# Patient Record
Sex: Male | Born: 1960
Health system: Southern US, Community
[De-identification: ages and names within clinical notes are randomized; demographics above are authoritative.]

## PROBLEM LIST (undated history)

## (undated) DIAGNOSIS — Z87891 Personal history of nicotine dependence: Secondary | ICD-10-CM

## (undated) HISTORY — DX: Personal history of nicotine dependence: Z87.891

---

## 1998-07-23 ENCOUNTER — Emergency Department (HOSPITAL_COMMUNITY): Admission: EM | Admit: 1998-07-23 | Discharge: 1998-07-23 | Payer: Self-pay | Admitting: Emergency Medicine

## 1998-07-25 ENCOUNTER — Inpatient Hospital Stay (HOSPITAL_COMMUNITY): Admission: AD | Admit: 1998-07-25 | Discharge: 1998-07-27 | Payer: Self-pay | Admitting: Specialist

## 1998-10-05 ENCOUNTER — Emergency Department (HOSPITAL_COMMUNITY): Admission: EM | Admit: 1998-10-05 | Discharge: 1998-10-05 | Payer: Self-pay | Admitting: Emergency Medicine

## 1998-12-27 ENCOUNTER — Emergency Department (HOSPITAL_COMMUNITY): Admission: EM | Admit: 1998-12-27 | Discharge: 1998-12-27 | Payer: Self-pay | Admitting: Emergency Medicine

## 2006-05-21 ENCOUNTER — Ambulatory Visit: Payer: Self-pay | Admitting: Internal Medicine

## 2006-10-30 ENCOUNTER — Telehealth: Payer: Self-pay | Admitting: Internal Medicine

## 2006-12-27 ENCOUNTER — Ambulatory Visit: Payer: Self-pay | Admitting: Internal Medicine

## 2007-01-15 ENCOUNTER — Ambulatory Visit: Payer: Self-pay | Admitting: Internal Medicine

## 2007-01-15 DIAGNOSIS — IMO0002 Reserved for concepts with insufficient information to code with codable children: Secondary | ICD-10-CM | POA: Insufficient documentation

## 2007-01-28 ENCOUNTER — Telehealth (INDEPENDENT_AMBULATORY_CARE_PROVIDER_SITE_OTHER): Payer: Self-pay | Admitting: *Deleted

## 2007-02-07 ENCOUNTER — Encounter: Payer: Self-pay | Admitting: Internal Medicine

## 2007-02-20 ENCOUNTER — Telehealth: Payer: Self-pay | Admitting: Family Medicine

## 2007-05-14 ENCOUNTER — Telehealth: Payer: Self-pay | Admitting: Internal Medicine

## 2007-12-11 ENCOUNTER — Ambulatory Visit: Payer: Self-pay | Admitting: Internal Medicine

## 2007-12-19 ENCOUNTER — Ambulatory Visit: Payer: Self-pay | Admitting: Internal Medicine

## 2007-12-19 DIAGNOSIS — R3589 Other polyuria: Secondary | ICD-10-CM | POA: Insufficient documentation

## 2007-12-19 DIAGNOSIS — R358 Other polyuria: Secondary | ICD-10-CM

## 2007-12-19 LAB — CONVERTED CEMR LAB
Nitrite: NEGATIVE
Urobilinogen, UA: NEGATIVE
WBC Urine, dipstick: NEGATIVE

## 2008-04-07 ENCOUNTER — Telehealth: Payer: Self-pay | Admitting: Internal Medicine

## 2010-08-06 ENCOUNTER — Encounter: Payer: Self-pay | Admitting: Family Medicine

## 2010-08-06 ENCOUNTER — Ambulatory Visit (INDEPENDENT_AMBULATORY_CARE_PROVIDER_SITE_OTHER): Payer: Self-pay | Admitting: Family Medicine

## 2010-08-06 ENCOUNTER — Encounter: Payer: Self-pay | Admitting: Internal Medicine

## 2010-08-06 DIAGNOSIS — R300 Dysuria: Secondary | ICD-10-CM

## 2010-08-06 DIAGNOSIS — J029 Acute pharyngitis, unspecified: Secondary | ICD-10-CM

## 2010-08-06 DIAGNOSIS — R509 Fever, unspecified: Secondary | ICD-10-CM | POA: Insufficient documentation

## 2010-08-06 LAB — POCT URINALYSIS DIPSTICK
Ketones, UA: NEGATIVE
Protein, UA: 30
Spec Grav, UA: 1.02
Urobilinogen, UA: 0.2

## 2010-08-06 MED ORDER — SULFAMETHOXAZOLE-TRIMETHOPRIM 800-160 MG PO TABS: 1.0000 | ORAL_TABLET | Freq: Two times a day (BID) | ORAL | Status: DC

## 2010-08-06 NOTE — Patient Instructions (Signed)
Take the bactrim ds 1 pill twice daily for 7 days  Drink lots of water - stay hydrated  Tylenol ES  Up to 2 pills every 4 hours for fever and chills  If you get worse - higher fever / worse back pain/ nausea/ vomiting - go in to ER  Follow up with Dr Kirtland Bouchard next week - call his office Monday for an appt

## 2010-08-06 NOTE — Assessment & Plan Note (Signed)
With mildly positive ua and neg rapid strep Suspect either viral syndrome / or uti (or prostatitis)  Will cover with sulfa (pt cannot afford cipro) Tylenol for fever /chills  If worse will go to ER Otherwise will f/u with Dr Kirtland Bouchard next week

## 2010-08-06 NOTE — Progress Notes (Signed)
Subjective:    Patient ID: Steven Wilkerson, male    DOB: [birth year], 50 y.o.   MRN: [id number]  HPI Flu like symptoms  Has been 3 days - and feels awful  Came out of nowhere - like a ton of bricks   Aches and pains in back and hips and back  Chills and sweats that are horrible - soaking his clothes  Gets so cold finger tips go numb   Little nasal stuffiness Dry sore throat -- not severe - scratchy Headache - on and off  Very little cough (he does smoke )   No rash   Took a percocet from family member yesterday - that helped   Before that tried advil and aleve  3 advil would help for a little while   Urine is more concentrated  Burned once when he urinated  No blood in urine  No n/v  History   Social History  . Marital Status: Married    Spouse Name: N/A    Number of Children: N/A  . Years of Education: N/A   Occupational History  . Not on file.   Social History Main Topics  . Smoking status: Current Everyday Smoker -- 1.0 packs/day for 30 years    Types: Cigarettes  . Smokeless tobacco: Not on file  . Alcohol Use: No     Sober x 10 years  . Drug Use: No  . Sexually Active: Not on file   Other Topics Concern  . Not on file   Social History Narrative  . No narrative on file   No past medical history on file.         Review of Systems Review of Systems  Constitutional: pos for fatigue/ malaise/fever / no wt changes  Eyes: Negative for pain and visual disturbance.  Respiratory: mild cough/ no sob   Cardiovascular: Negative. For cp or palpitations  Gastrointestinal: Negative for nausea, diarrhea and constipation. or vomiting Genitourinary: Negative for urgency and frequency. Pos for dysuria  Skin: Negative for pallor. neg for rash  Neurological: Negative for weakness, light-headedness, numbness and headaches.  Hematological: Negative for adenopathy. Does not bruise/bleed easily.  Psychiatric/Behavioral: Negative for dysphoric mood. The patient is not  nervous/anxious.          Objective:   Physical Exam  Constitutional: He appears well-developed and well-nourished. No distress.  HENT:  Head: Normocephalic and atraumatic.  Right Ear: External ear normal.  Left Ear: External ear normal.  Mouth/Throat: Oropharynx is clear and moist.       Nares boggy and slightly congested    Eyes: Conjunctivae and EOM are normal. Pupils are equal, round, and reactive to light. Right eye exhibits no discharge. Left eye exhibits no discharge. No scleral icterus.  Neck: Normal range of motion. Neck supple. No JVD present.  Cardiovascular: Normal rate, regular rhythm and normal heart sounds.   Pulmonary/Chest: Effort normal and breath sounds normal. No respiratory distress. He has no wheezes. He has no rales. He exhibits no tenderness.       milldy distant bs throughout  Abdominal: Soft. Bowel sounds are normal. He exhibits no distension and no mass. There is no tenderness.       No suprapubic tenderness    Musculoskeletal: Normal range of motion. He exhibits no edema and no tenderness.       No acute joint changes   Lymphadenopathy:    He has no cervical adenopathy.  Neurological: He is alert. He has normal reflexes.  No tremor   Skin: Skin is warm and dry. No rash noted. He is not diaphoretic. No erythema. No pallor.  Psychiatric: He has a normal mood and affect.          Assessment & Plan:

## 2010-08-07 DIAGNOSIS — J029 Acute pharyngitis, unspecified: Secondary | ICD-10-CM | POA: Insufficient documentation

## 2010-08-07 NOTE — Assessment & Plan Note (Signed)
Rapid strep neg  See assessment for fever  Poss part of viral syndrome  No rash or tick bite

## 2010-08-08 ENCOUNTER — Telehealth: Payer: Self-pay | Admitting: *Deleted

## 2010-08-08 ENCOUNTER — Ambulatory Visit (INDEPENDENT_AMBULATORY_CARE_PROVIDER_SITE_OTHER): Payer: Managed Care, Other (non HMO) | Admitting: Internal Medicine

## 2010-08-08 ENCOUNTER — Encounter: Payer: Self-pay | Admitting: Internal Medicine

## 2010-08-08 VITALS — BP 110/80 | Temp 97.9°F | Wt 156.0 lb

## 2010-08-08 DIAGNOSIS — N390 Urinary tract infection, site not specified: Secondary | ICD-10-CM

## 2010-08-08 DIAGNOSIS — R509 Fever, unspecified: Secondary | ICD-10-CM

## 2010-08-08 DIAGNOSIS — M549 Dorsalgia, unspecified: Secondary | ICD-10-CM

## 2010-08-08 LAB — POCT URINALYSIS DIPSTICK
Blood, UA: NEGATIVE
Leukocytes, UA: NEGATIVE
Nitrite, UA: POSITIVE
pH, UA: 5.5

## 2010-08-08 NOTE — Progress Notes (Signed)
Addended by: Rita Ohara R on: 08/08/2010 04:48 PM   Modules accepted: Orders

## 2010-08-08 NOTE — Patient Instructions (Signed)
Drink as much fluid as you  can tolerate over the next few days  Take your antibiotic as prescribed until ALL of it is gone, but stop if you develop a rash, swelling, or any side effects of the medication.  Contact our office as soon as possible if  there are side effects of the medication.  Call or return to clinic prn if these symptoms worsen or fail to improve as anticipated.   

## 2010-08-08 NOTE — Telephone Encounter (Signed)
[facility] Triage Call Report Triage Record Num: [id number] Operator: Sula Rumple Patient Name: Steven Wilkerson Call Date & Time: 08/06/2010 10:18:34PM Patient Phone: [phone] PCP: Patient Gender: Male PCP Fax : Patient DOB: November 14, 1960 Practice Name: [facility] Reason for Call: Pt calling on 08/06/10 states he is on antibiotic for a UTI/Bactrim/this PM pt is c/o rash on his back and chest. Per Dr Milinda Antis may order Macrobid 100 mg BID x 7 days. [facility] [phone]. Protocol(s) Used: Rash Recommended Outcome per Protocol: Call Provider within 4 Hours Reason for Outcome: New onset of rash after beginning new prescribed, nonprescribed, or alternative/complementary medication Care Advice: ~ 08/06/2010 10:39:37PM Page 1 of 1 [id number]

## 2010-08-08 NOTE — Progress Notes (Signed)
  Subjective:    Patient ID: Steven Wilkerson, male    DOB: 1961/01/12, 50 y.o.   MRN: [id number]  HPI and is 50 year old patient who is seen today in followup. He has a nine-day history of unwellness. He has had some intermittent fever chills. Other complaints include occasional numbness involving the fingertips dark urine and low back pain. He was seen at the [facility] 2 days ago and placed on Septra DS for a suspected UTI. This resulted in a rash and presently he is on nitrofurantoin. A urinalysis was repeated today that revealed trace glucose +2 bilirubin mild ketonuria and positive nitrates negative for leukocytes. He complains of decreased urinary stream dark urine but no dysuria he states he has been drinking fluids well      Review of Systems  Constitutional: Positive for fever, chills, appetite change and fatigue.  HENT: Negative for hearing loss, ear pain, congestion, sore throat, trouble swallowing, neck stiffness, dental problem, voice change and tinnitus.   Eyes: Negative for pain, discharge and visual disturbance.  Respiratory: Negative for cough, chest tightness, wheezing and stridor.   Cardiovascular: Negative for chest pain, palpitations and leg swelling.  Gastrointestinal: Negative for nausea, vomiting, abdominal pain, diarrhea, constipation, blood in stool and abdominal distention.  Genitourinary: Positive for decreased urine volume. Negative for urgency, hematuria, flank pain, discharge, difficulty urinating and genital sores.  Musculoskeletal: Positive for back pain. Negative for myalgias, joint swelling, arthralgias and gait problem.  Skin: Negative for rash.  Neurological: Negative for dizziness, syncope, speech difficulty, weakness, numbness and headaches.  Hematological: Negative for adenopathy. Does not bruise/bleed easily.  Psychiatric/Behavioral: Negative for behavioral problems and dysphoric mood. The patient is not nervous/anxious.        Objective:   Physical  Exam  Constitutional: He is oriented to person, place, and time. He appears well-developed and well-nourished. No distress.  HENT:  Head: Normocephalic.  Right Ear: External ear normal.  Left Ear: External ear normal.  Eyes: Conjunctivae and EOM are normal.       Suggestion of early scleral icterus  Neck: Normal range of motion.  Cardiovascular: Normal rate and normal heart sounds.   Pulmonary/Chest: Breath sounds normal.  Abdominal: Soft. Bowel sounds are normal.       Mild left and right upper quadrant tenderness bowel sounds active no definite organomegaly  Musculoskeletal: Normal range of motion. He exhibits no edema and no tenderness.  Neurological: He is alert and oriented to person, place, and time.  Skin: No rash noted.  Psychiatric: He has a normal mood and affect. His behavior is normal.          Assessment & Plan:  History of febrile illness Rule out hepatitis History of UTI  CBC CMP will be reviewed He has been asked to force fluids and continue antibiotic therapy

## 2010-08-09 ENCOUNTER — Telehealth: Payer: Self-pay

## 2010-08-09 LAB — CBC WITH DIFFERENTIAL/PLATELET
Basophils Relative: 0.4 % (ref 0.0–3.0)
Eosinophils Relative: 2.4 % (ref 0.0–5.0)
HCT: 41.1 % (ref 39.0–52.0)
Hemoglobin: 14.1 g/dL (ref 13.0–17.0)
Lymphocytes Relative: 27.2 % (ref 12.0–46.0)
Lymphs Abs: 1.5 10*3/uL (ref 0.7–4.0)
Monocytes Relative: 15.1 % — ABNORMAL HIGH (ref 3.0–12.0)
Neutro Abs: 3 10*3/uL (ref 1.4–7.7)
RBC: 4.35 Mil/uL (ref 4.22–5.81)
WBC: 5.4 10*3/uL (ref 4.5–10.5)

## 2010-08-09 LAB — HEPATIC FUNCTION PANEL
ALT: 150 U/L — ABNORMAL HIGH (ref 0–53)
AST: 158 U/L — ABNORMAL HIGH (ref 0–37)
Bilirubin, Direct: 0.3 mg/dL (ref 0.0–0.3)
Total Bilirubin: 0.9 mg/dL (ref 0.3–1.2)
Total Protein: 6.7 g/dL (ref 6.0–8.3)

## 2010-08-09 LAB — BASIC METABOLIC PANEL
CO2: 28 mEq/L (ref 19–32)
Chloride: 106 mEq/L (ref 96–112)
Creatinine, Ser: 0.8 mg/dL (ref 0.4–1.5)
Potassium: 4.1 mEq/L (ref 3.5–5.1)

## 2010-08-09 NOTE — Progress Notes (Signed)
Quick Note:  Spoke with pt - informed of lab results and sono to be ordered in AM - instructed to be NPO after midnight - will get with terri in AM to order sono ______

## 2010-08-09 NOTE — Telephone Encounter (Signed)
Opened in error

## 2010-08-10 ENCOUNTER — Other Ambulatory Visit: Payer: Self-pay | Admitting: Internal Medicine

## 2010-08-10 ENCOUNTER — Telehealth: Payer: Self-pay | Admitting: Internal Medicine

## 2010-08-10 ENCOUNTER — Ambulatory Visit (HOSPITAL_BASED_OUTPATIENT_CLINIC_OR_DEPARTMENT_OTHER)
Admission: RE | Admit: 2010-08-10 | Discharge: 2010-08-10 | Disposition: A | Discharge status: Home / Self Care | Payer: Managed Care, Other (non HMO) | Source: Ambulatory Visit | Attending: Internal Medicine | Admitting: Internal Medicine

## 2010-08-10 DIAGNOSIS — R945 Abnormal results of liver function studies: Secondary | ICD-10-CM | POA: Insufficient documentation

## 2010-08-10 DIAGNOSIS — R1012 Left upper quadrant pain: Secondary | ICD-10-CM | POA: Insufficient documentation

## 2010-08-10 DIAGNOSIS — R110 Nausea: Secondary | ICD-10-CM | POA: Insufficient documentation

## 2010-08-10 DIAGNOSIS — R935 Abnormal findings on diagnostic imaging of other abdominal regions, including retroperitoneum: Secondary | ICD-10-CM

## 2010-08-10 DIAGNOSIS — R112 Nausea with vomiting, unspecified: Secondary | ICD-10-CM

## 2010-08-10 DIAGNOSIS — R748 Abnormal levels of other serum enzymes: Secondary | ICD-10-CM

## 2010-08-10 DIAGNOSIS — R7989 Other specified abnormal findings of blood chemistry: Secondary | ICD-10-CM

## 2010-08-10 DIAGNOSIS — R509 Fever, unspecified: Secondary | ICD-10-CM | POA: Insufficient documentation

## 2010-08-10 NOTE — Telephone Encounter (Signed)
Pts wife called. Pt in extreme pain. Req results from ultrasound asap. Pt needs to know if he needs to continue antibiotics, or what he is suppose to do?

## 2010-08-10 NOTE — Telephone Encounter (Signed)
Results printed and on your desk please advise

## 2010-08-10 NOTE — Telephone Encounter (Signed)
Called and discussed. Recommend he reports in the hospital for admission and further evaluation

## 2010-08-10 NOTE — Progress Notes (Signed)
Quick Note:  Order places - terri aware ______

## 2010-08-11 ENCOUNTER — Telehealth: Payer: Self-pay | Admitting: *Deleted

## 2010-08-11 ENCOUNTER — Telehealth: Payer: Self-pay | Admitting: Internal Medicine

## 2010-08-11 DIAGNOSIS — R1032 Left lower quadrant pain: Secondary | ICD-10-CM

## 2010-08-11 NOTE — Telephone Encounter (Signed)
Spoke with wife - informed that a hep test was not run with other labs - will f/u after CT done tomorrow. Dr. Amador Cunas will order any other labs if indicated. KIK

## 2010-08-11 NOTE — Telephone Encounter (Signed)
Had labs done the other day,. Was hepatitis test ran? Leave message.

## 2010-08-11 NOTE — Telephone Encounter (Signed)
Pt is asking if he can have a CT scan instead of hospital admission.  Per Dr. Scotty Court, please order per recommendation on U/S results. Per Dr. Kirtland Bouchard, may order CT.  Pt notifed.

## 2010-08-12 ENCOUNTER — Ambulatory Visit (HOSPITAL_BASED_OUTPATIENT_CLINIC_OR_DEPARTMENT_OTHER)
Admission: RE | Admit: 2010-08-12 | Discharge: 2010-08-12 | Disposition: A | Discharge status: Home / Self Care | Payer: Managed Care, Other (non HMO) | Source: Ambulatory Visit | Attending: Internal Medicine | Admitting: Internal Medicine

## 2010-08-12 ENCOUNTER — Emergency Department (HOSPITAL_BASED_OUTPATIENT_CLINIC_OR_DEPARTMENT_OTHER)
Admission: EM | Admit: 2010-08-12 | Discharge: 2010-08-12 | Disposition: A | Discharge status: Home / Self Care | Payer: BC Managed Care – PPO | Attending: Emergency Medicine | Admitting: Emergency Medicine

## 2010-08-12 ENCOUNTER — Telehealth: Payer: Self-pay | Admitting: *Deleted

## 2010-08-12 DIAGNOSIS — R5383 Other fatigue: Secondary | ICD-10-CM | POA: Insufficient documentation

## 2010-08-12 DIAGNOSIS — F17200 Nicotine dependence, unspecified, uncomplicated: Secondary | ICD-10-CM | POA: Insufficient documentation

## 2010-08-12 DIAGNOSIS — R1032 Left lower quadrant pain: Secondary | ICD-10-CM

## 2010-08-12 DIAGNOSIS — R1012 Left upper quadrant pain: Secondary | ICD-10-CM | POA: Insufficient documentation

## 2010-08-12 DIAGNOSIS — R509 Fever, unspecified: Secondary | ICD-10-CM | POA: Insufficient documentation

## 2010-08-12 DIAGNOSIS — A779 Spotted fever, unspecified: Secondary | ICD-10-CM | POA: Insufficient documentation

## 2010-08-12 DIAGNOSIS — R5381 Other malaise: Secondary | ICD-10-CM | POA: Insufficient documentation

## 2010-08-12 LAB — DIFFERENTIAL
Basophils Absolute: 0 10*3/uL (ref 0.0–0.1)
Eosinophils Absolute: 0.1 10*3/uL (ref 0.0–0.7)
Lymphocytes Relative: 42 % (ref 12–46)
Lymphs Abs: 4.2 10*3/uL — ABNORMAL HIGH (ref 0.7–4.0)
Neutro Abs: 4.4 10*3/uL (ref 1.7–7.7)

## 2010-08-12 LAB — COMPREHENSIVE METABOLIC PANEL
AST: 63 U/L — ABNORMAL HIGH (ref 0–37)
CO2: 27 mEq/L (ref 19–32)
Calcium: 8.6 mg/dL (ref 8.4–10.5)
Chloride: 92 mEq/L — ABNORMAL LOW (ref 96–112)
Creatinine, Ser: 0.6 mg/dL (ref 0.50–1.35)
GFR calc Af Amer: >60 mL/min (ref >60)
GFR calc non Af Amer: >60 mL/min (ref >60)
Glucose, Bld: 125 mg/dL — ABNORMAL HIGH (ref 70–99)
Total Bilirubin: 1.6 mg/dL — ABNORMAL HIGH (ref 0.3–1.2)

## 2010-08-12 LAB — URINALYSIS, ROUTINE W REFLEX MICROSCOPIC
Glucose, UA: NEGATIVE mg/dL
Ketones, ur: NEGATIVE mg/dL
Nitrite: NEGATIVE
Specific Gravity, Urine: 1.044 — ABNORMAL HIGH (ref 1.005–1.030)
pH: 6.5 (ref 5.0–8.0)

## 2010-08-12 LAB — CBC
MCH: 31.5 pg (ref 26.0–34.0)
MCV: 86.6 fL (ref 78.0–100.0)
Platelets: 127 10*3/uL — ABNORMAL LOW (ref 150–400)
RDW: 11.4 % — ABNORMAL LOW (ref 11.5–15.5)
WBC: 9.9 10*3/uL (ref 4.0–10.5)

## 2010-08-12 LAB — URINE MICROSCOPIC-ADD ON

## 2010-08-12 LAB — CK: Total CK: 89 U/L (ref 7–232)

## 2010-08-12 MED ORDER — IOHEXOL 300 MG/ML  SOLN
100.0000 mL | Freq: Once | INTRAMUSCULAR | Status: AC | PRN
  Administered 2010-08-12: 100 mL via INTRAVENOUS

## 2010-08-12 NOTE — Telephone Encounter (Signed)
Pt called req ct results. Pt has already called imaging and was told that the ct has been read and forwarded to doctor Amador Cunas.

## 2010-08-12 NOTE — Telephone Encounter (Signed)
Called and discussed

## 2010-08-12 NOTE — Telephone Encounter (Signed)
Pt's wife calling to get results of stat CT done this morning.  Also patient is in a lot of pain would like to know if something can be called in for him.

## 2010-08-12 NOTE — Telephone Encounter (Signed)
Please advise - CT results in chart

## 2010-08-17 ENCOUNTER — Ambulatory Visit (INDEPENDENT_AMBULATORY_CARE_PROVIDER_SITE_OTHER): Payer: Managed Care, Other (non HMO) | Admitting: Internal Medicine

## 2010-08-17 VITALS — BP 102/62 | HR 95 | Temp 97.6°F | Resp 12 | Wt 155.0 lb

## 2010-08-17 DIAGNOSIS — E871 Hypo-osmolality and hyponatremia: Secondary | ICD-10-CM

## 2010-08-17 DIAGNOSIS — E876 Hypokalemia: Secondary | ICD-10-CM

## 2010-08-17 DIAGNOSIS — K759 Inflammatory liver disease, unspecified: Secondary | ICD-10-CM

## 2010-08-17 LAB — HEPATIC FUNCTION PANEL
ALT: 55 U/L — ABNORMAL HIGH (ref 0–53)
AST: 47 U/L — ABNORMAL HIGH (ref 0–37)
Albumin: 3.8 g/dL (ref 3.5–5.2)
Alkaline Phosphatase: 90 U/L (ref 39–117)
Total Protein: 6.9 g/dL (ref 6.0–8.3)

## 2010-08-17 LAB — BRAIN NATRIURETIC PEPTIDE: Pro B Natriuretic peptide (BNP): 13 pg/mL (ref 0.0–100.0)

## 2010-08-17 MED ORDER — TRAMADOL HCL 50 MG PO TBDP: 50.0000 mg | ORAL_TABLET | Freq: Four times a day (QID) | ORAL | Status: DC | PRN

## 2010-08-17 NOTE — Patient Instructions (Signed)
Call or return to clinic prn if these symptoms worsen or fail to improve as anticipated.

## 2010-08-18 ENCOUNTER — Encounter: Payer: Self-pay | Admitting: Internal Medicine

## 2010-08-18 NOTE — Progress Notes (Signed)
  Subjective:    Patient ID: Steven Wilkerson, male    DOB: 07-Jun-1960, 50 y.o.   MRN: [id number]  HPI   50 year old patient who is seen today in followup. He has had an acute febrile illness with elevated liver function studies. He was evaluated and treated in the ED last week and was noted to have significant electrolyte abnormalities. He received IV fluids and was noted to have both hyponatremia and hypokalemia. Today he feels much improved. Liver fossa studies were improving. ED records were reviewed. He has had outpatient abdominal ultrasound and subsequent abdominal CT scan due to his elevated liver function studies.  He presently is on doxycycline due to his acute febrile illness tick exposure and is noted to have a rash last week. [facility] spotted fever titers were negative he feels much improved. He is requesting FMLA form completion on his behalf    Review of Systems  Constitutional: Positive for fatigue. Negative for fever, chills and appetite change.  HENT: Negative for hearing loss, ear pain, congestion, sore throat, trouble swallowing, neck stiffness, dental problem, voice change and tinnitus.   Eyes: Negative for pain, discharge and visual disturbance.  Respiratory: Negative for cough, chest tightness, wheezing and stridor.   Cardiovascular: Negative for chest pain, palpitations and leg swelling.  Gastrointestinal: Negative for nausea, vomiting, abdominal pain, diarrhea, constipation, blood in stool and abdominal distention.  Genitourinary: Negative for urgency, hematuria, flank pain, discharge, difficulty urinating and genital sores.  Musculoskeletal: Negative for myalgias, back pain, joint swelling, arthralgias and gait problem.  Skin: Negative for rash.  Neurological: Positive for weakness. Negative for dizziness, syncope, speech difficulty, numbness and headaches.  Hematological: Negative for adenopathy. Does not bruise/bleed easily.  Psychiatric/Behavioral: Negative for  behavioral problems and dysphoric mood. The patient is not nervous/anxious.        Objective:   Physical Exam  Constitutional: He is oriented to person, place, and time. He appears well-developed.  HENT:  Head: Normocephalic.  Right Ear: External ear normal.  Left Ear: External ear normal.  Eyes: Conjunctivae and EOM are normal.  Neck: Normal range of motion.  Cardiovascular: Normal rate and normal heart sounds.   Pulmonary/Chest: Breath sounds normal.  Abdominal: Bowel sounds are normal.  Musculoskeletal: Normal range of motion. He exhibits no edema and no tenderness.  Neurological: He is alert and oriented to person, place, and time.  Psychiatric: He has a normal mood and affect. His behavior is normal.          Assessment & Plan:   Resolving hepatitis. Probable viral. The patient is present on doxycycline he'll complete therapy Electrolyte imbalance. We'll check followup electrolytes today as well as liver function studies FMLA forms will be completed The patient is clinically much improved or return here when necessary

## 2010-08-26 ENCOUNTER — Telehealth: Payer: Self-pay | Admitting: Internal Medicine

## 2010-08-26 NOTE — Telephone Encounter (Signed)
Spoke with pt - informed of dr. Vernon Prey instructions , will order mobic , and he does request referal to back doctor. Will order and terri will caontact once taken care of next week. KIK Please advise - ortho? Neuro?

## 2010-08-26 NOTE — Telephone Encounter (Signed)
Please advise 

## 2010-08-26 NOTE — Telephone Encounter (Signed)
Okay to refill generic Mobic 15 mg #50 to take one daily as needed Cannot give stronger narcotic than Vicodin-ask  patient if  he would like referral to a back specialist
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
## 2010-08-29 ENCOUNTER — Telehealth: Payer: Self-pay | Admitting: Internal Medicine

## 2010-08-29 NOTE — Telephone Encounter (Signed)
Do you recall these forms? 

## 2010-08-29 NOTE — Telephone Encounter (Signed)
Checking on status of FMLA  forms to be filled out. Saw Dr Kirtland Bouchard last week.

## 2010-08-30 NOTE — Telephone Encounter (Signed)
Steven Wilkerson please check on if these forms were faxed out, and let me know

## 2010-08-30 NOTE — Telephone Encounter (Signed)
Pts spouse called to check on status of FMLA forms. Form was suppose to be faxed back to # on Form asap. Pls call.

## 2010-09-01 ENCOUNTER — Telehealth: Payer: Self-pay | Admitting: Internal Medicine

## 2010-09-01 MED ORDER — HYDROCODONE-ACETAMINOPHEN 5-325 MG PO TABS: 2.0000 | ORAL_TABLET | Freq: Four times a day (QID) | ORAL | Status: DC | PRN

## 2010-09-01 MED ORDER — MELOXICAM 15 MG PO TABS: 15.0000 mg | ORAL_TABLET | Freq: Every day | ORAL | Status: DC

## 2010-09-01 NOTE — Telephone Encounter (Signed)
Pt aware rx called in to [facility]

## 2010-09-01 NOTE — Telephone Encounter (Signed)
Needs status of Mobic and a higher dosage of Vicoden sent to [facility].

## 2010-09-01 NOTE — Telephone Encounter (Signed)
Continue present Vicodin dose-  may use one or 2 every 6 hours as needed

## 2010-09-01 NOTE — Telephone Encounter (Signed)
I took care og mobic - but is there a higher dose of vicodin to be ordered? He was given hydroco-apap 5/325mg  - please advise

## 2010-09-02 ENCOUNTER — Telehealth: Payer: Self-pay | Admitting: *Deleted

## 2010-09-02 NOTE — Telephone Encounter (Signed)
Office follow Monday or Tuesday

## 2010-09-02 NOTE — Telephone Encounter (Signed)
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 I had discussed with dr. Amador Cunas - he indicated pt had been placed on ABX-doxy, and that should be ok. After talking with him - he is ok with waiting to see ortho - since he has completed abx- I will forward to dr. Amador Cunas.

## 2010-09-02 NOTE — Telephone Encounter (Signed)
Attempt to call- ansmach - LMTCB and schedule appt - dr. Amador Cunas would like to f/u on Monday or tuesday

## 2010-09-02 NOTE — Telephone Encounter (Signed)
Pt is asking for a referral to Infectious Disease re: all the symptoms he has had in the last month.  He has been extremely ill, and would really like to be evaluated to see what his diagnosis might be.

## 2011-08-28 ENCOUNTER — Encounter (HOSPITAL_COMMUNITY): Payer: Self-pay | Admitting: *Deleted

## 2011-08-28 ENCOUNTER — Emergency Department (HOSPITAL_COMMUNITY)
Admission: EM | Admit: 2011-08-28 | Discharge: 2011-08-28 | Disposition: A | Discharge status: Home / Self Care | Payer: No Typology Code available for payment source | Attending: Emergency Medicine | Admitting: Emergency Medicine

## 2011-08-28 ENCOUNTER — Emergency Department (HOSPITAL_COMMUNITY): Payer: No Typology Code available for payment source

## 2011-08-28 DIAGNOSIS — M542 Cervicalgia: Secondary | ICD-10-CM | POA: Insufficient documentation

## 2011-08-28 DIAGNOSIS — M25529 Pain in unspecified elbow: Secondary | ICD-10-CM | POA: Insufficient documentation

## 2011-08-28 DIAGNOSIS — IMO0002 Reserved for concepts with insufficient information to code with codable children: Secondary | ICD-10-CM | POA: Insufficient documentation

## 2011-08-28 DIAGNOSIS — M545 Low back pain, unspecified: Secondary | ICD-10-CM | POA: Insufficient documentation

## 2011-08-28 DIAGNOSIS — M25579 Pain in unspecified ankle and joints of unspecified foot: Secondary | ICD-10-CM | POA: Insufficient documentation

## 2011-08-28 DIAGNOSIS — F17200 Nicotine dependence, unspecified, uncomplicated: Secondary | ICD-10-CM | POA: Insufficient documentation

## 2011-08-28 DIAGNOSIS — T07XXXA Unspecified multiple injuries, initial encounter: Secondary | ICD-10-CM

## 2011-08-28 DIAGNOSIS — M25569 Pain in unspecified knee: Secondary | ICD-10-CM | POA: Insufficient documentation

## 2011-08-28 DIAGNOSIS — Z79899 Other long term (current) drug therapy: Secondary | ICD-10-CM | POA: Insufficient documentation

## 2011-08-28 DIAGNOSIS — R079 Chest pain, unspecified: Secondary | ICD-10-CM | POA: Insufficient documentation

## 2011-08-28 DIAGNOSIS — M25519 Pain in unspecified shoulder: Secondary | ICD-10-CM | POA: Insufficient documentation

## 2011-08-28 DIAGNOSIS — S0081XA Abrasion of other part of head, initial encounter: Secondary | ICD-10-CM

## 2011-08-28 MED ORDER — MELOXICAM 7.5 MG PO TABS: ORAL_TABLET | ORAL | Status: DC

## 2011-08-28 MED ORDER — HYDROCODONE-ACETAMINOPHEN 7.5-325 MG PO TABS: 1.0000 | ORAL_TABLET | ORAL | Status: DC | PRN

## 2011-08-28 MED ORDER — METHOCARBAMOL 500 MG PO TABS: ORAL_TABLET | ORAL | Status: DC
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
## 2011-08-31 NOTE — ED Provider Notes (Signed)
Medical screening examination/treatment/procedure(s) were performed by non-physician practitioner and as supervising physician I was immediately available for consultation/collaboration.   Wilmetta Speiser W. Panayiota Larkin, MD 08/31/11 1235 

## 2011-09-01 ENCOUNTER — Ambulatory Visit (INDEPENDENT_AMBULATORY_CARE_PROVIDER_SITE_OTHER): Payer: No Typology Code available for payment source | Admitting: Internal Medicine

## 2011-09-01 ENCOUNTER — Encounter: Payer: Self-pay | Admitting: Internal Medicine

## 2011-09-01 VITALS — BP 100/80 | Temp 97.6°F | Wt 157.0 lb

## 2011-09-01 DIAGNOSIS — IMO0002 Reserved for concepts with insufficient information to code with codable children: Secondary | ICD-10-CM

## 2011-09-01 DIAGNOSIS — R52 Pain, unspecified: Secondary | ICD-10-CM

## 2011-09-01 MED ORDER — HYDROCODONE-ACETAMINOPHEN 7.5-325 MG PO TABS: 1.0000 | ORAL_TABLET | ORAL | Status: DC | PRN

## 2011-09-01 MED ORDER — MEPERIDINE HCL 25 MG/ML IJ SOLN
50.0000 mg | Freq: Once | INTRAMUSCULAR | Status: AC
  Administered 2011-09-01: 50 mg via INTRAMUSCULAR

## 2011-09-01 MED ORDER — PROMETHAZINE HCL 50 MG/ML IJ SOLN
50.0000 mg | Freq: Once | INTRAMUSCULAR | Status: AC
  Administered 2011-09-01: 50 mg via INTRAMUSCULAR

## 2011-09-01 MED ORDER — CYCLOBENZAPRINE HCL 10 MG PO TABS: 10.0000 mg | ORAL_TABLET | Freq: Three times a day (TID) | ORAL | Status: AC | PRN
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
## 2011-09-01 NOTE — Patient Instructions (Signed)
You  may move around, but avoid painful motions and activities.  Apply heat to the sore area for 15 to 20 minutes 3 or 4 times daily for the next two to 3 days. 

## 2011-09-05 ENCOUNTER — Other Ambulatory Visit: Payer: Self-pay | Admitting: Internal Medicine

## 2011-09-05 NOTE — Telephone Encounter (Signed)
30
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
## 2011-09-24 ENCOUNTER — Other Ambulatory Visit: Payer: Self-pay | Admitting: Internal Medicine
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
## 2011-09-25 NOTE — Telephone Encounter (Signed)
ok 

## 2011-10-10 ENCOUNTER — Other Ambulatory Visit: Payer: Self-pay | Admitting: Internal Medicine
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
## 2011-10-11 NOTE — Telephone Encounter (Signed)
ok 

## 2011-10-26 ENCOUNTER — Other Ambulatory Visit: Payer: Self-pay | Admitting: Internal Medicine
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
## 2011-10-27 NOTE — Telephone Encounter (Signed)
Called in.

## 2011-10-27 NOTE — Telephone Encounter (Signed)
ok 

## 2011-11-06 ENCOUNTER — Other Ambulatory Visit: Payer: Self-pay | Admitting: Internal Medicine

## 2011-11-06 NOTE — Telephone Encounter (Signed)
ok 
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
## 2011-11-07 NOTE — Telephone Encounter (Signed)
Called in.

## 2011-11-20 ENCOUNTER — Other Ambulatory Visit: Payer: Self-pay | Admitting: Internal Medicine

## 2011-11-20 NOTE — Telephone Encounter (Signed)
30
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
## 2011-12-04 ENCOUNTER — Other Ambulatory Visit: Payer: Self-pay | Admitting: Internal Medicine

## 2011-12-05 NOTE — Telephone Encounter (Signed)
ok 

## 2011-12-05 NOTE — Telephone Encounter (Signed)
Last seen 09/01/11  Last written 11/20/11  # 30  0RF Please advise

## 2011-12-05 NOTE — Telephone Encounter (Signed)
CALLED IN 

## 2011-12-16 ENCOUNTER — Other Ambulatory Visit: Payer: Self-pay | Admitting: Internal Medicine
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
## 2011-12-18 NOTE — Telephone Encounter (Signed)
Called into [facility]

## 2011-12-18 NOTE — Telephone Encounter (Signed)
ok 

## 2011-12-25 ENCOUNTER — Ambulatory Visit: Payer: PRIVATE HEALTH INSURANCE | Admitting: Internal Medicine

## 2011-12-26 ENCOUNTER — Encounter: Payer: Self-pay | Admitting: Internal Medicine

## 2011-12-26 ENCOUNTER — Ambulatory Visit (INDEPENDENT_AMBULATORY_CARE_PROVIDER_SITE_OTHER): Payer: PRIVATE HEALTH INSURANCE | Admitting: Internal Medicine

## 2011-12-26 VITALS — BP 122/80 | Wt 162.0 lb

## 2011-12-26 DIAGNOSIS — IMO0002 Reserved for concepts with insufficient information to code with codable children: Secondary | ICD-10-CM

## 2011-12-26 NOTE — Patient Instructions (Signed)
Orthopedic evaluation as discussed 
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
## 2012-01-02 ENCOUNTER — Other Ambulatory Visit: Payer: Self-pay | Admitting: Internal Medicine

## 2012-01-02 NOTE — Telephone Encounter (Signed)
30

## 2012-01-02 NOTE — Telephone Encounter (Signed)
Norco refill request for pt. Last filled on 10/26. Pt last seen on 11/5 ok to refill?

## 2012-01-03 NOTE — Telephone Encounter (Signed)
Med called in

## 2012-01-22 ENCOUNTER — Other Ambulatory Visit: Payer: Self-pay | Admitting: Internal Medicine

## 2012-01-23 ENCOUNTER — Telehealth: Payer: Self-pay | Admitting: Internal Medicine

## 2012-01-23 NOTE — Telephone Encounter (Signed)
Pt needs to get another referral to Orthopedic doctor. Pt req to see Dr Romeo Apple in [place], [place].       Phone # [phone] and the fax # [phone]. Need to get copy of doctors notes from Dr Amador Cunas and also put on referral that pt went to [facility].

## 2012-01-25 NOTE — Telephone Encounter (Signed)
All ok

## 2012-01-25 NOTE — Telephone Encounter (Signed)
48

## 2012-01-26 ENCOUNTER — Other Ambulatory Visit: Payer: Self-pay | Admitting: Internal Medicine

## 2012-01-26 DIAGNOSIS — IMO0002 Reserved for concepts with insufficient information to code with codable children: Secondary | ICD-10-CM

## 2012-01-26 NOTE — Telephone Encounter (Signed)
Has referral been performed??

## 2012-02-12 ENCOUNTER — Other Ambulatory Visit: Payer: Self-pay | Admitting: Internal Medicine

## 2012-03-06 ENCOUNTER — Other Ambulatory Visit: Payer: Self-pay | Admitting: Internal Medicine

## 2012-03-22 ENCOUNTER — Other Ambulatory Visit: Payer: Self-pay | Admitting: Internal Medicine

## 2012-03-27 ENCOUNTER — Other Ambulatory Visit: Payer: Self-pay | Admitting: *Deleted

## 2012-04-06 IMAGING — CT CT ABD-PELV W/ CM
2 of 5 series · 16 of 46 positions shown, 18 images · IV contrast (APPLIED)
Comparison: Ultrasound of the abdomen of 08/10/2010

CLINICAL DATA: Abdomen and left upper quadrant pain, fever, chills

CT ABDOMEN AND PELVIS WITH CONTRAST
TECHNIQUE: Multidetector CT imaging of the abdomen and pelvis was
performed following the standard protocol during bolus
administration of intravenous contrast.
Contrast: 100 ml Cmnipaque-ASS

[Series 2: abd/pelvis 5.0 b31f · axial · 0.64mm/px · z∈[+1030,+1386]mm · 13 of 79 slices shown, 15 images]
[im 4/79  soft-tissue]
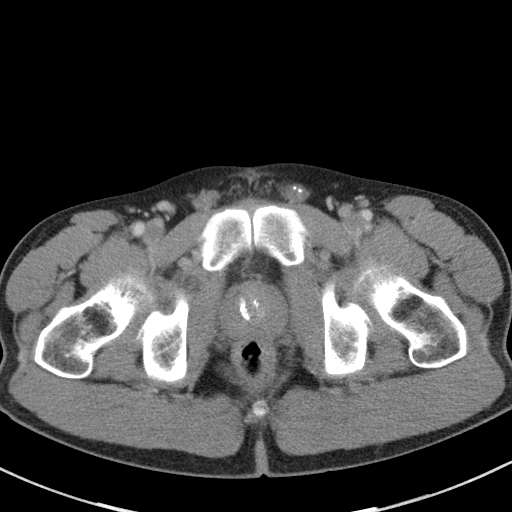
[im 4/79  bone]
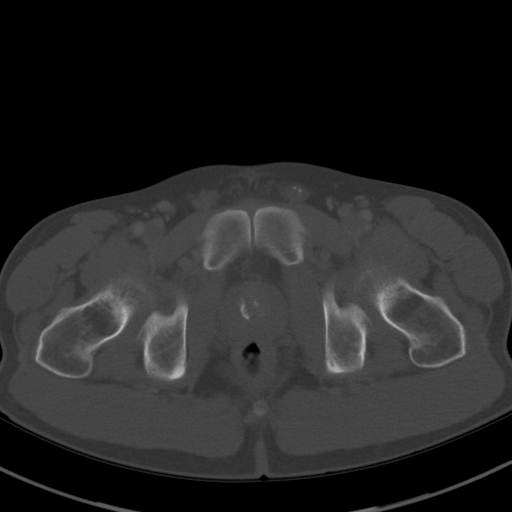
[im 12/79  soft-tissue]
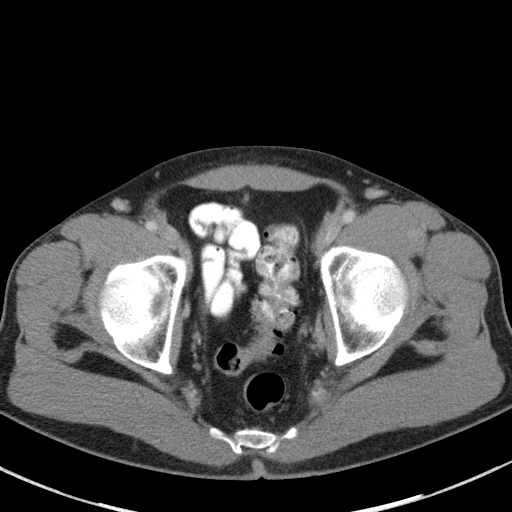
[im 16/79  soft-tissue]
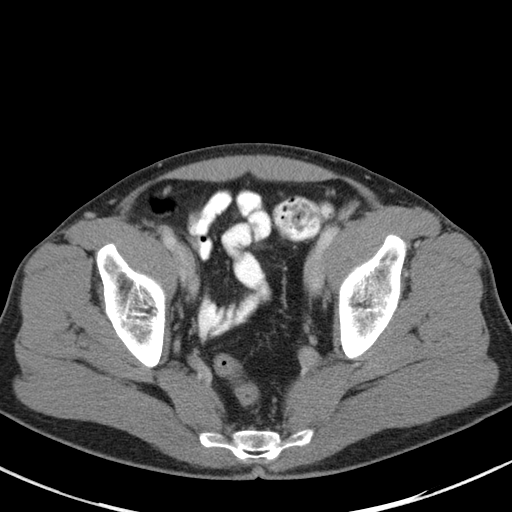
[im 24/79  soft-tissue]
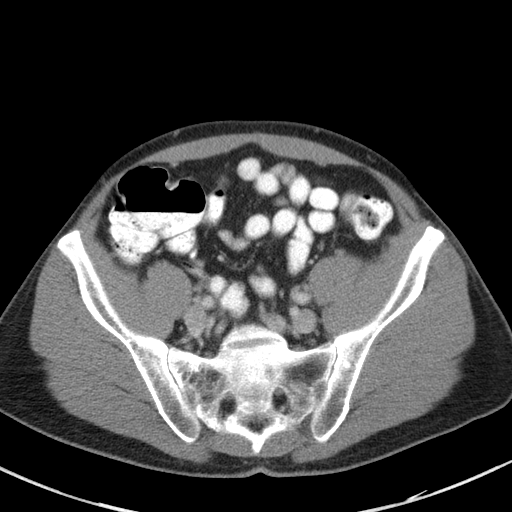
[im 28/79  soft-tissue]
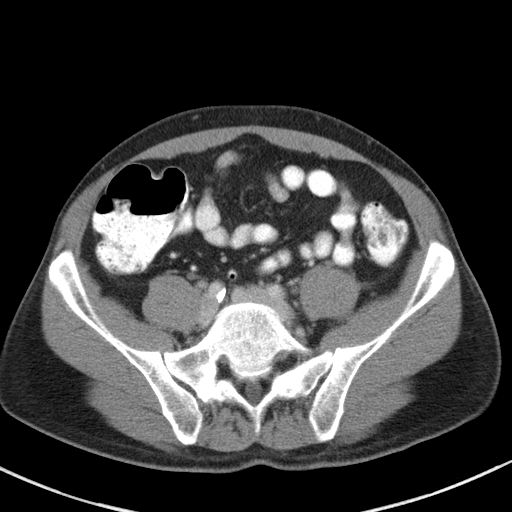
[im 36/79  soft-tissue]
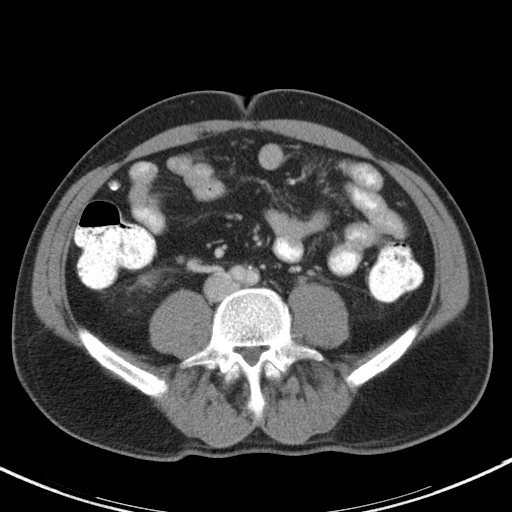
[im 40/79  soft-tissue]
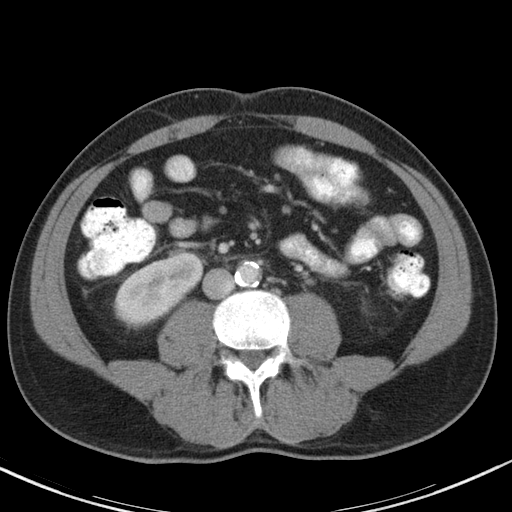
[im 43/79  soft-tissue]
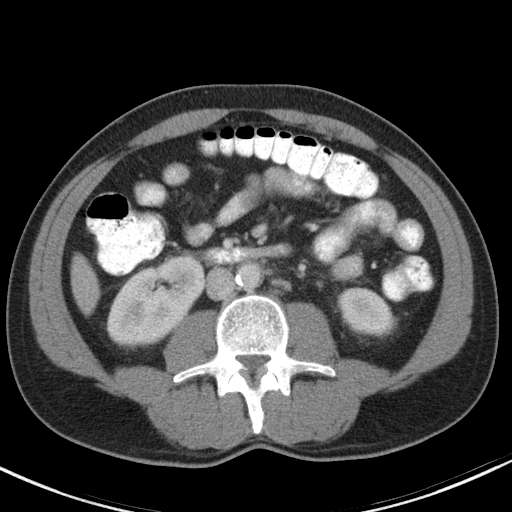
[im 51/79  soft-tissue]
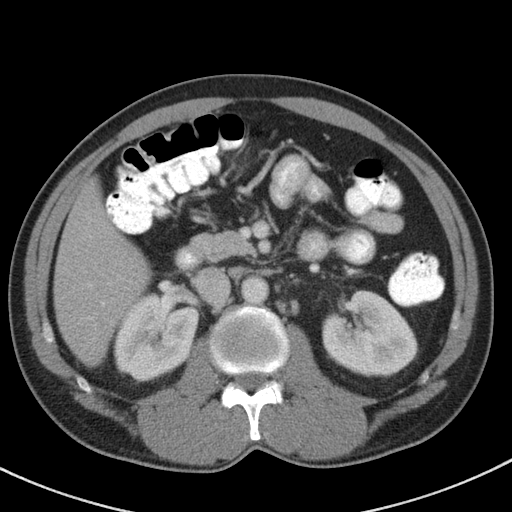
[im 51/79  bone]
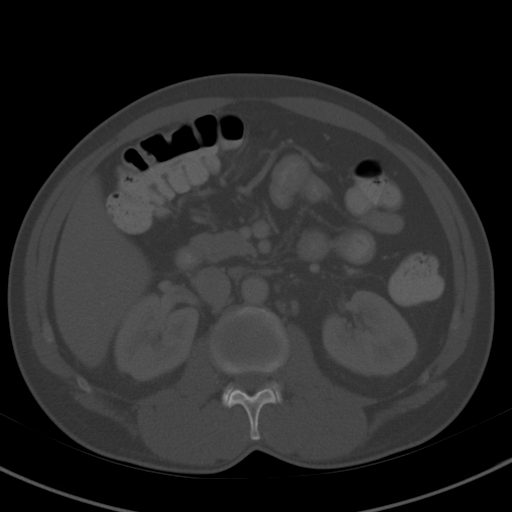
[im 55/79  soft-tissue]
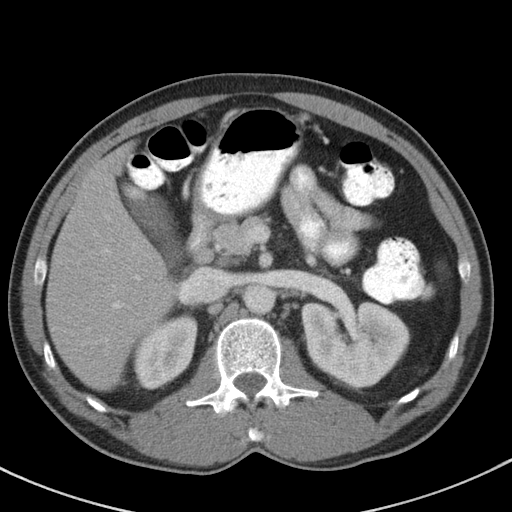
[im 63/79  soft-tissue]
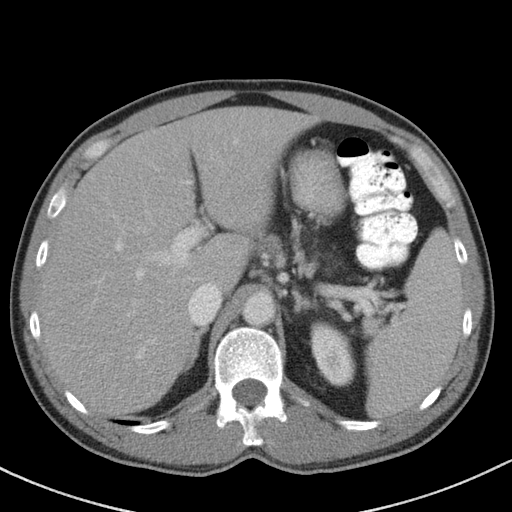
[im 67/79  soft-tissue]
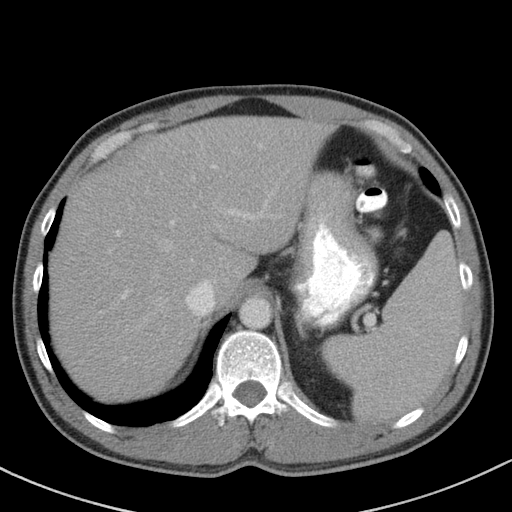
[im 75/79  soft-tissue]
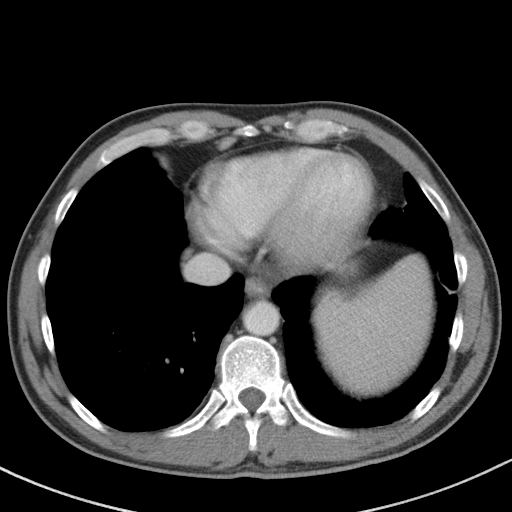

[Series 5: abd/pelvis 3.0 coronal · coronal · 0.63mm/px · 3 of 87 slices shown]
[im 29/87  soft-tissue]
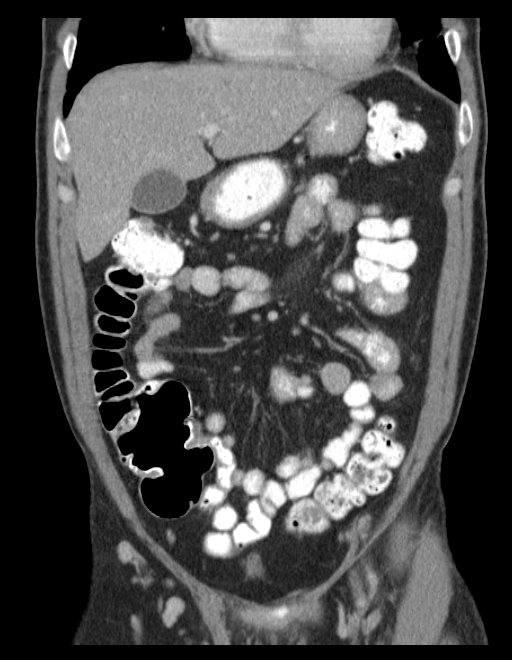
[im 39/87  soft-tissue]
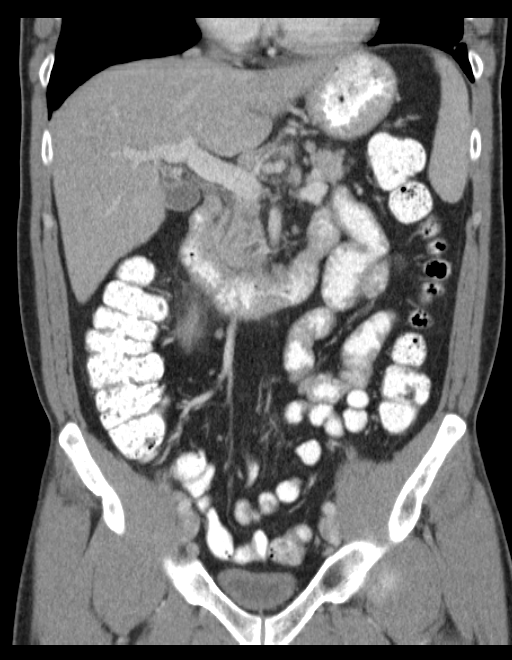
[im 48/87  soft-tissue]
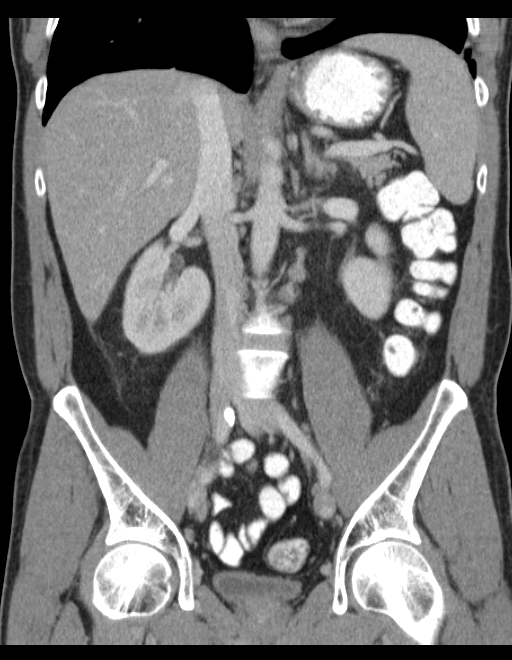

[16 of 46 positions shown; findings below may reference images not displayed]

FINDINGS: The lung bases are clear.  No effusion is seen.  The
liver enhances and no definite ductal dilatation is seen as was
questioned on recent ultrasound.  Only a single small low
attenuation lesion is noted in the dome of the liver most
consistent with benign process such as cyst.  No calcified
gallstones are seen.  The pancreas is normal in size and the
pancreatic duct is not dilated.  The adrenal glands and spleen are
unremarkable although the spleen is minimally prominent.  The
stomach is not well distended.  The kidneys enhance with no
calculus or mass and no hydronephrosis is seen.  The abdominal
aorta is normal in caliber with moderate atheromatous change
present.  No adenopathy is noted.

The appendix is well seen in the right lower quadrant and fills
with air normally.  The terminal ileum is unremarkable.  The
urinary bladder is somewhat thick-walled but not distended.  The
prostate is prominent and a degree of bladder outlet obstruction is
a consideration.  The prostate measures 34 x 42 mm and contains
calcifications.  No abnormality of the colon is seen.  No fluid is
noted within the pelvis.  There is a partial compression deformity
of L2 vertebral body of uncertain age.  Clinical correlation is
recommended.
IMPRESSION: 1.  No acute abnormality on CT of the abdomen and pelvis.
2.  No evidence of intrahepatic ductal dilatation as was questioned
on recent ultrasound.  The pancreas appears normal.
3.  Somewhat thick-walled urinary bladder with prominent prostate.
Consider a degree of bladder outlet obstruction.
4.  Partial compression deformity of L2 vertebral body of uncertain
age.  Correlate clinically.

## 2012-04-09 ENCOUNTER — Other Ambulatory Visit: Payer: Self-pay | Admitting: Internal Medicine

## 2012-04-25 ENCOUNTER — Other Ambulatory Visit: Payer: Self-pay | Admitting: Internal Medicine

## 2012-04-28 NOTE — Telephone Encounter (Signed)
Ok to RF? 

## 2012-05-14 ENCOUNTER — Other Ambulatory Visit: Payer: Self-pay | Admitting: Internal Medicine

## 2012-05-18 ENCOUNTER — Other Ambulatory Visit: Payer: Self-pay | Admitting: Internal Medicine

## 2012-06-07 ENCOUNTER — Other Ambulatory Visit: Payer: Self-pay | Admitting: Internal Medicine

## 2012-06-10 ENCOUNTER — Encounter: Payer: Self-pay | Admitting: *Deleted

## 2012-06-26 ENCOUNTER — Other Ambulatory Visit: Payer: Self-pay | Admitting: Internal Medicine

## 2012-07-08 ENCOUNTER — Other Ambulatory Visit: Payer: Self-pay | Admitting: Internal Medicine

## 2012-07-29 ENCOUNTER — Other Ambulatory Visit: Payer: Self-pay | Admitting: Internal Medicine

## 2012-08-20 ENCOUNTER — Other Ambulatory Visit: Payer: Self-pay | Admitting: Internal Medicine

## 2012-08-21 NOTE — Telephone Encounter (Signed)
ok 

## 2012-08-21 NOTE — Telephone Encounter (Signed)
rx called in

## 2012-09-03 ENCOUNTER — Other Ambulatory Visit: Payer: Self-pay | Admitting: Internal Medicine

## 2012-09-24 ENCOUNTER — Other Ambulatory Visit: Payer: Self-pay | Admitting: Internal Medicine

## 2012-09-26 ENCOUNTER — Telehealth: Payer: Self-pay | Admitting: Internal Medicine
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
## 2012-09-26 NOTE — Telephone Encounter (Signed)
Rx was filled last on 7/15, pt has been getting Rx filled sometimes twice a month. Do you want to increase dispense amount and say last for 30 days? Pt has not seen you since 12/2011 and you referred him to Ortho.

## 2012-09-27 NOTE — Telephone Encounter (Signed)
Suggest return office visit next week

## 2012-09-27 NOTE — Telephone Encounter (Signed)
Spoke to pt told him Dr. Kirtland Bouchard would like him to make an appointment next week to discuss pain medication. Pt verbalized understanding and will call back and schedule appt.

## 2012-10-04 ENCOUNTER — Ambulatory Visit (INDEPENDENT_AMBULATORY_CARE_PROVIDER_SITE_OTHER): Payer: Managed Care, Other (non HMO) | Admitting: Internal Medicine

## 2012-10-04 ENCOUNTER — Encounter: Payer: Self-pay | Admitting: Internal Medicine

## 2012-10-04 VITALS — BP 110/76 | HR 90 | Temp 98.2°F | Resp 20 | Wt 156.0 lb

## 2012-10-04 DIAGNOSIS — M25519 Pain in unspecified shoulder: Secondary | ICD-10-CM

## 2012-10-04 DIAGNOSIS — M25512 Pain in left shoulder: Secondary | ICD-10-CM

## 2012-10-04 MED ORDER — MELOXICAM 7.5 MG PO TABS: ORAL_TABLET | ORAL | Status: DC

## 2012-10-04 MED ORDER — HYDROCODONE-ACETAMINOPHEN 7.5-325 MG PO TABS: ORAL_TABLET | ORAL | Status: DC
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
## 2012-10-04 NOTE — Patient Instructions (Signed)
Orthopedic followup if unimproved  Impingement Syndrome, Rotator Cuff, Bursitis with Rehab Impingement syndrome is a condition that involves inflammation of the tendons of the rotator cuff and the subacromial bursa, that causes pain in the shoulder. The rotator cuff consists of four tendons and muscles that control much of the shoulder and upper arm function. The subacromial bursa is a fluid filled sac that helps reduce friction between the rotator cuff and one of the bones of the shoulder (acromion). Impingement syndrome is usually an overuse injury that causes swelling of the bursa (bursitis), swelling of the tendon (tendonitis), and/or a tear of the tendon (strain). Strains are classified into three categories. Grade 1 strains cause pain, but the tendon is not lengthened. Grade 2 strains include a lengthened ligament, due to the ligament being stretched or partially ruptured. With grade 2 strains there is still function, although the function may be decreased. Grade 3 strains include a complete tear of the tendon or muscle, and function is usually impaired. SYMPTOMS   Pain around the shoulder, often at the outer portion of the upper arm.  Pain that gets worse with shoulder function, especially when reaching overhead or lifting.  Sometimes, aching when not using the arm.  Pain that wakes you up at night.  Sometimes, tenderness, swelling, warmth, or redness over the affected area.  Loss of strength.  Limited motion of the shoulder, especially reaching behind the back (to the back pocket or to unhook bra) or across your body.  Crackling sound (crepitation) when moving the arm.  Biceps tendon pain and inflammation (in the front of the shoulder). Worse when bending the elbow or lifting. CAUSES  Impingement syndrome is often an overuse injury, in which chronic (repetitive) motions cause the tendons or bursa to become inflamed. A strain occurs when a force is paced on the tendon or muscle that  is greater than it can withstand. Common mechanisms of injury include: Stress from sudden increase in duration, frequency, or intensity of training.  Direct hit (trauma) to the shoulder.  Aging, erosion of the tendon with normal use.  Bony bump on shoulder (acromial spur). RISK INCREASES WITH:  Contact sports (football, wrestling, boxing).  Throwing sports (baseball, tennis, volleyball).  Weightlifting and bodybuilding.  Heavy labor.  Previous injury to the rotator cuff, including impingement.  Poor shoulder strength and flexibility.  Failure to warm up properly before activity.  Inadequate protective equipment.  Old age.  Bony bump on shoulder (acromial spur). PREVENTION   Warm up and stretch properly before activity.  Allow for adequate recovery between workouts.  Maintain physical fitness:  Strength, flexibility, and endurance.  Cardiovascular fitness.  Learn and use proper exercise technique. PROGNOSIS  If treated properly, impingement syndrome usually goes away within 6 weeks. Sometimes surgery is required.  RELATED COMPLICATIONS   Longer healing time if not properly treated, or if not given enough time to heal.  Recurring symptoms, that result in a chronic condition.  Shoulder stiffness, frozen shoulder, or loss of motion.  Rotator cuff tendon tear.  Recurring symptoms, especially if activity is resumed too soon, with overuse, with a direct blow, or when using poor technique. TREATMENT  Treatment first involves the use of ice and medicine, to reduce pain and inflammation. The use of strengthening and stretching exercises may help reduce pain with activity. These exercises may be performed at home or with a therapist. If non-surgical treatment is unsuccessful after more than 6 months, surgery may be advised. After surgery and rehabilitation, activity is  usually possible in 3 months.  MEDICATION  If pain medicine is needed, nonsteroidal anti-inflammatory  medicines (aspirin and ibuprofen), or other minor pain relievers (acetaminophen), are often advised.  Do not take pain medicine for 7 days before surgery.  Prescription pain relievers may be given, if your caregiver thinks they are needed. Use only as directed and only as much as you need.  Corticosteroid injections may be given by your caregiver. These injections should be reserved for the most serious cases, because they may only be given a certain number of times. HEAT AND COLD  Cold treatment (icing) should be applied for 10 to 15 minutes every 2 to 3 hours for inflammation and pain, and immediately after activity that aggravates your symptoms. Use ice packs or an ice massage.  Heat treatment may be used before performing stretching and strengthening activities prescribed by your caregiver, physical therapist, or athletic trainer. Use a heat pack or a warm water soak. SEEK MEDICAL CARE IF:   Symptoms get worse or do not improve in 4 to 6 weeks, despite treatment.  New, unexplained symptoms develop. (Drugs used in treatment may produce side effects.) EXERCISES  RANGE OF MOTION (ROM) AND STRETCHING EXERCISES - Impingement Syndrome (Rotator Cuff  Tendinitis, Bursitis) These exercises may help you when beginning to rehabilitate your injury. Your symptoms may go away with or without further involvement from your physician, physical therapist or athletic trainer. While completing these exercises, remember:   Restoring tissue flexibility helps normal motion to return to the joints. This allows healthier, less painful movement and activity.  An effective stretch should be held for at least 30 seconds.  A stretch should never be painful. You should only feel a gentle lengthening or release in the stretched tissue. STRETCH  Flexion, Standing  Stand with good posture. With an underhand grip on your right / left hand, and an overhand grip on the opposite hand, grasp a broomstick or cane so that  your hands are a little more than shoulder width apart.  Keeping your right / left elbow straight and shoulder muscles relaxed, push the stick with your opposite hand, to raise your right / left arm in front of your body and then overhead. Raise your arm until you feel a stretch in your right / left shoulder, but before you have increased shoulder pain.  Try to avoid shrugging your right / left shoulder as your arm rises, by keeping your shoulder blade tucked down and toward your mid-back spine. Hold for __________ seconds.  Slowly return to the starting position. Repeat __________ times. Complete this exercise __________ times per day. STRETCH  Abduction, Supine  Lie on your back. With an underhand grip on your right / left hand and an overhand grip on the opposite hand, grasp a broomstick or cane so that your hands are a little more than shoulder width apart.  Keeping your right / left elbow straight and your shoulder muscles relaxed, push the stick with your opposite hand, to raise your right / left arm out to the side of your body and then overhead. Raise your arm until you feel a stretch in your right / left shoulder, but before you have increased shoulder pain.  Try to avoid shrugging your right / left shoulder as your arm rises, by keeping your shoulder blade tucked down and toward your mid-back spine. Hold for __________ seconds.  Slowly return to the starting position. Repeat __________ times. Complete this exercise __________ times per day. ROM  Flexion,  Active-Assisted  Lie on your back. You may bend your knees for comfort.  Grasp a broomstick or cane so your hands are about shoulder width apart. Your right / left hand should grip the end of the stick, so that your hand is positioned "thumbs-up," as if you were about to shake hands.  Using your healthy arm to lead, raise your right / left arm overhead, until you feel a gentle stretch in your shoulder. Hold for __________  seconds.  Use the stick to assist in returning your right / left arm to its starting position. Repeat __________ times. Complete this exercise __________ times per day.  ROM - Internal Rotation, Supine   Lie on your back on a firm surface. Place your right / left elbow about 60 degrees away from your side. Elevate your elbow with a folded towel, so that the elbow and shoulder are the same height.  Using a broomstick or cane and your strong arm, pull your right / left hand toward your body until you feel a gentle stretch, but no increase in your shoulder pain. Keep your shoulder and elbow in place throughout the exercise.  Hold for __________ seconds. Slowly return to the starting position. Repeat __________ times. Complete this exercise __________ times per day. STRETCH - Internal Rotation  Place your right / left hand behind your back, palm up.  Throw a towel or belt over your opposite shoulder. Grasp the towel with your right / left hand.  While keeping an upright posture, gently pull up on the towel, until you feel a stretch in the front of your right / left shoulder.  Avoid shrugging your right / left shoulder as your arm rises, by keeping your shoulder blade tucked down and toward your mid-back spine.  Hold for __________ seconds. Release the stretch, by lowering your healthy hand. Repeat __________ times. Complete this exercise __________ times per day. ROM - Internal Rotation   Using an underhand grip, grasp a stick behind your back with both hands.  While standing upright with good posture, slide the stick up your back until you feel a mild stretch in the front of your shoulder.  Hold for __________ seconds. Slowly return to your starting position. Repeat __________ times. Complete this exercise __________ times per day.  STRETCH  Posterior Shoulder Capsule   Stand or sit with good posture. Grasp your right / left elbow and draw it across your chest, keeping it at the same  height as your shoulder.  Pull your elbow, so your upper arm comes in closer to your chest. Pull until you feel a gentle stretch in the back of your shoulder.  Hold for __________ seconds. Repeat __________ times. Complete this exercise __________ times per day. STRENGTHENING EXERCISES - Impingement Syndrome (Rotator Cuff Tendinitis, Bursitis) These exercises may help you when beginning to rehabilitate your injury. They may resolve your symptoms with or without further involvement from your physician, physical therapist or athletic trainer. While completing these exercises, remember:  Muscles can gain both the endurance and the strength needed for everyday activities through controlled exercises.  Complete these exercises as instructed by your physician, physical therapist or athletic trainer. Increase the resistance and repetitions only as guided.  You may experience muscle soreness or fatigue, but the pain or discomfort you are trying to eliminate should never worsen during these exercises. If this pain does get worse, stop and make sure you are following the directions exactly. If the pain is still present after adjustments, discontinue  the exercise until you can discuss the trouble with your clinician.  During your recovery, avoid activity or exercises which involve actions that place your injured hand or elbow above your head or behind your back or head. These positions stress the tissues which you are trying to heal. STRENGTH - Scapular Depression and Adduction   With good posture, sit on a firm chair. Support your arms in front of you, with pillows, arm rests, or on a table top. Have your elbows in line with the sides of your body.  Gently draw your shoulder blades down and toward your mid-back spine. Gradually increase the tension, without tensing the muscles along the top of your shoulders and the back of your neck.  Hold for __________ seconds. Slowly release the tension and relax  your muscles completely before starting the next repetition.  After you have practiced this exercise, remove the arm support and complete the exercise in standing as well as sitting position. Repeat __________ times. Complete this exercise __________ times per day.  STRENGTH - Shoulder Abductors, Isometric  With good posture, stand or sit about 4-6 inches from a wall, with your right / left side facing the wall.  Bend your right / left elbow. Gently press your right / left elbow into the wall. Increase the pressure gradually, until you are pressing as hard as you can, without shrugging your shoulder or increasing any shoulder discomfort.  Hold for __________ seconds.  Release the tension slowly. Relax your shoulder muscles completely before you begin the next repetition. Repeat __________ times. Complete this exercise __________ times per day.  STRENGTH - External Rotators, Isometric  Keep your right / left elbow at your side and bend it 90 degrees.  Step into a door frame so that the outside of your right / left wrist can press against the door frame without your upper arm leaving your side.  Gently press your right / left wrist into the door frame, as if you were trying to swing the back of your hand away from your stomach. Gradually increase the tension, until you are pressing as hard as you can, without shrugging your shoulder or increasing any shoulder discomfort.  Hold for __________ seconds.  Release the tension slowly. Relax your shoulder muscles completely before you begin the next repetition. Repeat __________ times. Complete this exercise __________ times per day.  STRENGTH - Supraspinatus   Stand or sit with good posture. Grasp a __________ weight, or an exercise band or tubing, so that your hand is "thumbs-up," like you are shaking hands.  Slowly lift your right / left arm in a "V" away from your thigh, diagonally into the space between your side and straight ahead. Lift  your hand to shoulder height or as far as you can, without increasing any shoulder pain. At first, many people do not lift their hands above shoulder height.  Avoid shrugging your right / left shoulder as your arm rises, by keeping your shoulder blade tucked down and toward your mid-back spine.  Hold for __________ seconds. Control the descent of your hand, as you slowly return to your starting position. Repeat __________ times. Complete this exercise __________ times per day.  STRENGTH - External Rotators  Secure a rubber exercise band or tubing to a fixed object (table, pole) so that it is at the same height as your right / left elbow when you are standing or sitting on a firm surface.  Stand or sit so that the secured exercise band is  at your uninjured side.  Bend your right / left elbow 90 degrees. Place a folded towel or small pillow under your right / left arm, so that your elbow is a few inches away from your side.  Keeping the tension on the exercise band, pull it away from your body, as if pivoting on your elbow. Be sure to keep your body steady, so that the movement is coming only from your rotating shoulder.  Hold for __________ seconds. Release the tension in a controlled manner, as you return to the starting position. Repeat __________ times. Complete this exercise __________ times per day.  STRENGTH - Internal Rotators   Secure a rubber exercise band or tubing to a fixed object (table, pole) so that it is at the same height as your right / left elbow when you are standing or sitting on a firm surface.  Stand or sit so that the secured exercise band is at your right / left side.  Bend your elbow 90 degrees. Place a folded towel or small pillow under your right / left arm so that your elbow is a few inches away from your side.  Keeping the tension on the exercise band, pull it across your body, toward your stomach. Be sure to keep your body steady, so that the movement is coming  only from your rotating shoulder.  Hold for __________ seconds. Release the tension in a controlled manner, as you return to the starting position. Repeat __________ times. Complete this exercise __________ times per day.  STRENGTH - Scapular Protractors, Standing   Stand arms length away from a wall. Place your hands on the wall, keeping your elbows straight.  Begin by dropping your shoulder blades down and toward your mid-back spine.  To strengthen your protractors, keep your shoulder blades down, but slide them forward on your rib cage. It will feel as if you are lifting the back of your rib cage away from the wall. This is a subtle motion and can be challenging to complete. Ask your caregiver for further instruction, if you are not sure you are doing the exercise correctly.  Hold for __________ seconds. Slowly return to the starting position, resting the muscles completely before starting the next repetition. Repeat __________ times. Complete this exercise __________ times per day. STRENGTH - Scapular Protractors, Supine  Lie on your back on a firm surface. Extend your right / left arm straight into the air while holding a __________ weight in your hand.  Keeping your head and back in place, lift your shoulder off the floor.  Hold for __________ seconds. Slowly return to the starting position, and allow your muscles to relax completely before starting the next repetition. Repeat __________ times. Complete this exercise __________ times per day. STRENGTH - Scapular Protractors, Quadruped  Get onto your hands and knees, with your shoulders directly over your hands (or as close as you can be, comfortably).  Keeping your elbows locked, lift the back of your rib cage up into your shoulder blades, so your mid-back rounds out. Keep your neck muscles relaxed.  Hold this position for __________ seconds. Slowly return to the starting position and allow your muscles to relax completely before  starting the next repetition. Repeat __________ times. Complete this exercise __________ times per day.  STRENGTH - Scapular Retractors  Secure a rubber exercise band or tubing to a fixed object (table, pole), so that it is at the height of your shoulders when you are either standing, or sitting on a  firm armless chair.  With a palm down grip, grasp an end of the band in each hand. Straighten your elbows and lift your hands straight in front of you, at shoulder height. Step back, away from the secured end of the band, until it becomes tense.  Squeezing your shoulder blades together, draw your elbows back toward your sides, as you bend them. Keep your upper arms lifted away from your body throughout the exercise.  Hold for __________ seconds. Slowly ease the tension on the band, as you reverse the directions and return to the starting position. Repeat __________ times. Complete this exercise __________ times per day. STRENGTH - Shoulder Extensors   Secure a rubber exercise band or tubing to a fixed object (table, pole) so that it is at the height of your shoulders when you are either standing, or sitting on a firm armless chair.  With a thumbs-up grip, grasp an end of the band in each hand. Straighten your elbows and lift your hands straight in front of you, at shoulder height. Step back, away from the secured end of the band, until it becomes tense.  Squeezing your shoulder blades together, pull your hands down to the sides of your thighs. Do not allow your hands to go behind you.  Hold for __________ seconds. Slowly ease the tension on the band, as you reverse the directions and return to the starting position. Repeat __________ times. Complete this exercise __________ times per day.  STRENGTH - Scapular Retractors and External Rotators   Secure a rubber exercise band or tubing to a fixed object (table, pole) so that it is at the height as your shoulders, when you are either standing, or  sitting on a firm armless chair.  With a palm down grip, grasp an end of the band in each hand. Bend your elbows 90 degrees and lift your elbows to shoulder height, at your sides. Step back, away from the secured end of the band, until it becomes tense.  Squeezing your shoulder blades together, rotate your shoulders so that your upper arms and elbows remain stationary, but your fists travel upward to head height.  Hold for __________ seconds. Slowly ease the tension on the band, as you reverse the directions and return to the starting position. Repeat __________ times. Complete this exercise __________ times per day.  STRENGTH - Scapular Retractors and External Rotators, Rowing   Secure a rubber exercise band or tubing to a fixed object (table, pole) so that it is at the height of your shoulders, when you are either standing, or sitting on a firm armless chair.  With a palm down grip, grasp an end of the band in each hand. Straighten your elbows and lift your hands straight in front of you, at shoulder height. Step back, away from the secured end of the band, until it becomes tense.  Step 1: Squeeze your shoulder blades together. Bending your elbows, draw your hands to your chest, as if you are rowing a boat. At the end of this motion, your hands and elbow should be at shoulder height and your elbows should be out to your sides.  Step 2: Rotate your shoulders, to raise your hands above your head. Your forearms should be vertical and your upper arms should be horizontal.  Hold for __________ seconds. Slowly ease the tension on the band, as you reverse the directions and return to the starting position. Repeat __________ times. Complete this exercise __________ times per day.  STRENGTH  Scapular  Depressors  Find a sturdy chair without wheels, such as a dining room chair.  Keeping your feet on the floor, and your hands on the chair arms, lift your bottom up from the seat, and lock your  elbows.  Keeping your elbows straight, allow gravity to pull your body weight down. Your shoulders will rise toward your ears.  Raise your body against gravity by drawing your shoulder blades down your back, shortening the distance between your shoulders and ears. Although your feet should always maintain contact with the floor, your feet should progressively support less body weight, as you get stronger.  Hold for __________ seconds. In a controlled and slow manner, lower your body weight to begin the next repetition. Repeat __________ times. Complete this exercise __________ times per day.  Document Released: 02/06/2005 Document Revised: 05/01/2011 Document Reviewed: 05/21/2008 [facility] Patient Information 2014 [place], [place].

## 2012-12-06 ENCOUNTER — Telehealth: Payer: Self-pay | Admitting: Internal Medicine

## 2012-12-06 NOTE — Telephone Encounter (Signed)
Pt request refill of HYDROcodone-acetaminophen (NORCO) 7.5-325 MG per tablet ° °

## 2012-12-06 NOTE — Telephone Encounter (Signed)
Spoke to pt told him can pick Rx up on Monday will have ready after 9:00 AM. Pt verbalized understanding.

## 2012-12-09 MED ORDER — HYDROCODONE-ACETAMINOPHEN 7.5-325 MG PO TABS: ORAL_TABLET | ORAL | Status: DC

## 2012-12-09 NOTE — Telephone Encounter (Signed)
Rx printed and signed and put at front desk for pickup. 

## 2013-01-07 ENCOUNTER — Telehealth: Payer: Self-pay | Admitting: Internal Medicine

## 2013-01-07 NOTE — Telephone Encounter (Signed)
Pt needs new rx hydrocodone °

## 2013-01-07 NOTE — Telephone Encounter (Signed)
Left detailed message Rx will be ready on Thurs for pickup.

## 2013-01-09 MED ORDER — HYDROCODONE-ACETAMINOPHEN 7.5-325 MG PO TABS: ORAL_TABLET | ORAL | Status: DC

## 2013-01-09 NOTE — Telephone Encounter (Signed)
Rx printed and signed, put at front desk for pick up. 

## 2013-02-05 ENCOUNTER — Telehealth: Payer: Self-pay | Admitting: Internal Medicine

## 2013-02-05 NOTE — Telephone Encounter (Signed)
Pt needs new rx hydrocodone °

## 2013-02-06 NOTE — Telephone Encounter (Signed)
Pt notified Rx will be ready tomorrow for pickup. Pt verbalized understanding.

## 2013-02-07 MED ORDER — HYDROCODONE-ACETAMINOPHEN 7.5-325 MG PO TABS: ORAL_TABLET | ORAL | Status: DC

## 2013-02-07 NOTE — Telephone Encounter (Signed)
Rx printed and signed, put at front desk for pick up. 

## 2013-03-03 ENCOUNTER — Telehealth: Payer: Self-pay | Admitting: Internal Medicine

## 2013-03-03 NOTE — Telephone Encounter (Signed)
Pt needs new rx hydrocodone °

## 2013-03-04 NOTE — Telephone Encounter (Signed)
Spoke to pt told him Rx will be ready Friday for pickup. Pt verbalized understanding.

## 2013-03-07 MED ORDER — HYDROCODONE-ACETAMINOPHEN 7.5-325 MG PO TABS: ORAL_TABLET | ORAL | Status: DC

## 2013-03-07 NOTE — Telephone Encounter (Signed)
Rx printed and signed, put at front desk for pick up. 

## 2013-03-11 ENCOUNTER — Ambulatory Visit: Payer: Managed Care, Other (non HMO) | Admitting: Internal Medicine

## 2013-03-11 ENCOUNTER — Ambulatory Visit: Payer: BC Managed Care – PPO | Admitting: Internal Medicine

## 2013-03-14 ENCOUNTER — Ambulatory Visit: Payer: Self-pay | Admitting: Internal Medicine

## 2013-04-02 ENCOUNTER — Telehealth: Payer: Self-pay | Admitting: Internal Medicine

## 2013-04-02 NOTE — Telephone Encounter (Signed)
Pt requesting refill of HYDROcodone-acetaminophen (NORCO) 7.5-325 MG per tablet ° °

## 2013-04-03 MED ORDER — HYDROCODONE-ACETAMINOPHEN 7.5-325 MG PO TABS: ORAL_TABLET | ORAL | Status: DC

## 2013-04-03 NOTE — Telephone Encounter (Signed)
Spoke to pt told him Rx ready for pickup, will be at the front desk. Pt verbalized understanding. Rx printed and signed.

## 2013-04-28 ENCOUNTER — Telehealth: Payer: Self-pay | Admitting: Internal Medicine

## 2013-04-28 NOTE — Telephone Encounter (Signed)
Pt needs new rx hydrocodone °

## 2013-04-30 MED ORDER — HYDROCODONE-ACETAMINOPHEN 7.5-325 MG PO TABS: ORAL_TABLET | ORAL | Status: DC

## 2013-04-30 NOTE — Telephone Encounter (Signed)
Left detailed message Rx ready for pickup, will be at the front desk. Rx printed and signed. 

## 2013-05-07 ENCOUNTER — Encounter: Payer: Self-pay | Admitting: Family Medicine

## 2013-05-07 ENCOUNTER — Telehealth: Payer: Self-pay | Admitting: Internal Medicine

## 2013-05-07 ENCOUNTER — Ambulatory Visit (INDEPENDENT_AMBULATORY_CARE_PROVIDER_SITE_OTHER): Payer: BC Managed Care – PPO | Admitting: Family Medicine

## 2013-05-07 VITALS — BP 128/76 | HR 91 | Wt 153.0 lb

## 2013-05-07 DIAGNOSIS — R350 Frequency of micturition: Secondary | ICD-10-CM

## 2013-05-07 DIAGNOSIS — N529 Male erectile dysfunction, unspecified: Secondary | ICD-10-CM

## 2013-05-07 DIAGNOSIS — R351 Nocturia: Secondary | ICD-10-CM

## 2013-05-07 LAB — POCT URINALYSIS DIPSTICK
Bilirubin, UA: NEGATIVE
Glucose, UA: NEGATIVE
KETONES UA: NEGATIVE
Leukocytes, UA: NEGATIVE
Nitrite, UA: NEGATIVE
PROTEIN UA: NEGATIVE
RBC UA: NEGATIVE
SPEC GRAV UA: 1.015
UROBILINOGEN UA: 0.2
pH, UA: 5.5

## 2013-05-07 LAB — GLUCOSE, POCT (MANUAL RESULT ENTRY): POC Glucose: 93 mg/dl (ref 70–99)

## 2013-05-07 MED ORDER — TADALAFIL 5 MG PO TABS: 5.0000 mg | ORAL_TABLET | Freq: Every day | ORAL | Status: DC | PRN

## 2013-05-07 NOTE — Progress Notes (Signed)
Pre visit review using our clinic review tool, if applicable. No additional management support is needed unless otherwise documented below in the visit note. 

## 2013-05-07 NOTE — Progress Notes (Signed)
   Subjective:    Patient ID: Steven Wilkerson, male    DOB: 11/13/1960, 53 y.o.   MRN: [id number]  Benign Prostatic Hypertrophy Irritative symptoms include frequency. Pertinent negatives include no chills or hematuria.   Patient seen with some frequent urination. Present for several months. He has recently developed some nocturia usually 2 times per night. He describes occasional dribbling of urine but in describing this he is actually having some residual urine at end of urination. He has slightly slow stream compared to previous years. He has not noted any burning with urination. No hematuria. He's also developed some erectile dysfunction over the past year or so. Good libido.  He has never had PSA to his knowledge. He drinks about 12 ounces of coffee per day and this is usual for him. No alcohol use. No diuretic use. Does have some increased thirst. Recent mild weight loss with good appetite.  No past medical history on file. No past surgical history on file.  reports that he has been smoking Cigarettes.  He has a 30 pack-year smoking history. He has never used smokeless tobacco. He reports that he does not drink alcohol or use illicit drugs. family history is not on file. Allergies  Allergen Reactions  . Sulfa Drugs Cross Reactors Rash  . Penicillins Itching      Review of Systems  Constitutional: Negative for fever, chills and appetite change.  Gastrointestinal: Negative for abdominal pain.  Endocrine: Positive for polydipsia and polyuria. Negative for polyphagia.  Genitourinary: Positive for frequency. Negative for hematuria, flank pain and difficulty urinating.       Objective:   Physical Exam  Constitutional: He appears well-developed and well-nourished.  Cardiovascular: Normal rate and regular rhythm.   Pulmonary/Chest: Effort normal and breath sounds normal. No respiratory distress. He has no wheezes. He has no rales.  Genitourinary:  Prostate not particularly enlarged.  No rectal mass. No prostate nodules. No prostate tenderness          Assessment & Plan:  Patient presents with urine frequency and nocturia. He does not have any major obstructive symptoms but may have some mild BPH. Will check urinalysis and PSA. Minimize caffeine use. Consider trial of daily Cialis 5 mg daily especially in view of his recent erectile dysfunction symptoms.

## 2013-05-07 NOTE — Telephone Encounter (Signed)
Relevant patient education mailed to patient.  

## 2013-05-08 LAB — PSA: PSA: 3.14 ng/mL (ref 0.10–4.00)

## 2013-05-09 ENCOUNTER — Encounter: Payer: Self-pay | Admitting: Family Medicine

## 2013-05-21 ENCOUNTER — Telehealth: Payer: Self-pay

## 2013-05-21 NOTE — Telephone Encounter (Signed)
Pt was in the office today and stated he was given Cailis at his last appointment and it works great. Pt was told at the pharmacy that he needed to make sure that his PCP and it noted that he takes this medication for BPH (for next refill).  Pls advise.

## 2013-05-25 ENCOUNTER — Encounter: Payer: Self-pay | Admitting: Family Medicine

## 2013-05-26 MED ORDER — TADALAFIL 5 MG PO TABS: ORAL_TABLET | ORAL | Status: DC

## 2013-05-26 NOTE — Telephone Encounter (Signed)
Spoke to pt told him Dr. Kirtland BouchardK wrote a letter for insurance to cover Cialis and I also sent new Rx to pharmacy stated medication is for BPH. Pt verbalized understanding and asked if I would mail letter to him. Told pt I will put in the mail today. Pt verbalized understanding.

## 2013-05-28 ENCOUNTER — Telehealth: Payer: Self-pay | Admitting: Internal Medicine

## 2013-05-28 NOTE — Telephone Encounter (Signed)
Pt needs new rx hydrocodone °

## 2013-05-29 MED ORDER — HYDROCODONE-ACETAMINOPHEN 7.5-325 MG PO TABS: ORAL_TABLET | ORAL | Status: DC

## 2013-05-29 NOTE — Telephone Encounter (Signed)
Pt notified Rx ready for pickup, will be at the front desk. Rx printed and signed. 

## 2013-06-03 ENCOUNTER — Telehealth: Payer: Self-pay | Admitting: Internal Medicine

## 2013-06-03 NOTE — Telephone Encounter (Signed)
The PA was submitted on 06/02/13.  I am waiting on a reply back.

## 2013-06-03 NOTE — Telephone Encounter (Signed)
Pt was rx by Dr Caryl NeverBurchette cialis 5mg  for BPH. Pt states his insurance company is now requiring prior auth. [id number]  for coverage review.  Pt is almost out/ used free 30 day card the first time. Dr Kirtland BouchardK wrote a letter concerning this issue which is in computer if you need. Pt would like a call when approved.  [facility]

## 2013-06-06 ENCOUNTER — Telehealth: Payer: Self-pay | Admitting: Internal Medicine

## 2013-06-06 NOTE — Telephone Encounter (Signed)
Pt needs samples of cialis 5mg . Pt PA has been denied. Pt is out

## 2013-06-09 NOTE — Telephone Encounter (Signed)
Spoke to pt told him we do not have any samples of Cialis right now. Pt verbalized understanding.

## 2013-06-24 ENCOUNTER — Encounter: Payer: Self-pay | Admitting: Internal Medicine

## 2013-06-24 ENCOUNTER — Encounter: Payer: Self-pay | Admitting: *Deleted

## 2013-06-24 ENCOUNTER — Other Ambulatory Visit: Payer: Self-pay | Admitting: *Deleted

## 2013-06-24 MED ORDER — TADALAFIL 5 MG PO TABS: ORAL_TABLET | ORAL | Status: DC

## 2013-06-24 NOTE — Telephone Encounter (Signed)
Spoke to pt asked him where letter needs to be sent to? Pt said [facility]. Told pt okay will have letter sent over and will let him know when has been approved or not. Pt verbalized understanding.

## 2013-06-27 ENCOUNTER — Telehealth: Payer: Self-pay | Admitting: Internal Medicine

## 2013-06-27 ENCOUNTER — Encounter: Payer: Self-pay | Admitting: Internal Medicine

## 2013-06-27 NOTE — Telephone Encounter (Signed)
Spoke to pt told him I do not have any samples or coupons for Cialis. Pt verbalized understanding. Told pt Rx will be ready on Monday for pickup. Pt verbalized understanding.

## 2013-06-27 NOTE — Telephone Encounter (Signed)
Spoke to pt told him the person in our office that does PA said she spoke with insurance and they told her Rx would not be covered even if letter was sent so she did not send. Pt said he spoke with them again. Told him okay I will send letter to [employer] Appeals and see what happens. Pt verbalized understanding. Letter faxed.

## 2013-06-27 NOTE — Telephone Encounter (Signed)
Pt wanted to know if u had any coupons for cialis or samples // pt already use the one for 30 day free trails on cialis that is put out by [employer]i [facility]   Pt also req rx on HYDROcodone-acetaminophen (NORCO) 7.5-325 MG per tablet

## 2013-06-27 NOTE — Telephone Encounter (Signed)
Pt would like to know if you have any coupon for cialis

## 2013-06-30 MED ORDER — HYDROCODONE-ACETAMINOPHEN 7.5-325 MG PO TABS: ORAL_TABLET | ORAL | Status: DC

## 2013-06-30 NOTE — Telephone Encounter (Signed)
Left detailed message Rx ready for pickup. Rx printed and signed. 

## 2013-07-24 ENCOUNTER — Other Ambulatory Visit: Payer: Self-pay | Admitting: *Deleted

## 2013-07-24 MED ORDER — TADALAFIL 5 MG PO TABS: ORAL_TABLET | ORAL | Status: DC

## 2013-07-24 NOTE — Telephone Encounter (Signed)
Received paperwork for drug assistance for pt from [facility] for Cialis 5 mg one tablet daily for BPH. Request filled out and Rx faxed with paperwork to Blima Singer at [phone].

## 2013-08-01 ENCOUNTER — Telehealth: Payer: Self-pay | Admitting: Internal Medicine

## 2013-08-01 NOTE — Telephone Encounter (Signed)
Spoke to pt told him Rx will be ready on Monday due to Dr. Kirtland BouchardK is out of the office. Pt verbalized understanding.

## 2013-08-01 NOTE — Telephone Encounter (Signed)
Pt is needing new rx HYDROcodone-acetaminophen (NORCO) 7.5-325 MG per tablet, please call when available for pick up. ° °

## 2013-08-04 MED ORDER — HYDROCODONE-ACETAMINOPHEN 7.5-325 MG PO TABS: ORAL_TABLET | ORAL | Status: DC

## 2013-08-04 NOTE — Telephone Encounter (Signed)
Rx ready for pick up and Left message on machine  

## 2013-09-01 ENCOUNTER — Telehealth: Payer: Self-pay | Admitting: Internal Medicine

## 2013-09-01 MED ORDER — HYDROCODONE-ACETAMINOPHEN 7.5-325 MG PO TABS: ORAL_TABLET | ORAL | Status: DC

## 2013-09-01 NOTE — Telephone Encounter (Signed)
Pt request refill HYDROcodone-acetaminophen (NORCO) 7.5-325 MG per tablet °

## 2013-09-01 NOTE — Telephone Encounter (Signed)
Left message on voicemail to call office on home and mobile.  

## 2013-09-02 NOTE — Telephone Encounter (Signed)
Pt notified Rx ready for pickup. Rx printed and signed.  

## 2013-09-30 ENCOUNTER — Telehealth: Payer: Self-pay | Admitting: Internal Medicine

## 2013-09-30 NOTE — Telephone Encounter (Signed)
Pt is needing new rx HYDROcodone-acetaminophen (NORCO) 7.5-325 MG per tablet please call when available for pick up ° °

## 2013-10-01 MED ORDER — HYDROCODONE-ACETAMINOPHEN 7.5-325 MG PO TABS: ORAL_TABLET | ORAL | Status: DC

## 2013-10-01 NOTE — Telephone Encounter (Signed)
Pt notified Rx ready for pickup. Rx printed and signed.  

## 2013-10-14 ENCOUNTER — Encounter: Payer: Self-pay | Admitting: Internal Medicine

## 2013-10-16 ENCOUNTER — Encounter: Payer: Self-pay | Admitting: Family Medicine

## 2013-10-28 ENCOUNTER — Other Ambulatory Visit: Payer: Self-pay

## 2013-10-28 MED ORDER — SILDENAFIL CITRATE 20 MG PO TABS: ORAL_TABLET | ORAL | Status: DC

## 2013-10-28 MED ORDER — TAMSULOSIN HCL 0.4 MG PO CAPS: 0.4000 mg | ORAL_CAPSULE | Freq: Every day | ORAL | Status: DC

## 2013-10-29 ENCOUNTER — Telehealth: Payer: Self-pay | Admitting: Internal Medicine

## 2013-10-29 NOTE — Telephone Encounter (Signed)
I called to submit the PA request for Sildenafil and it was denied.  I had it submitted for ED and BPH and I was advised by the representative that the medication is not approved for diagnosis of ED or BPH.  Medication was prescribed by Dr. Caryl Never.

## 2013-10-30 ENCOUNTER — Telehealth: Payer: Self-pay | Admitting: Internal Medicine

## 2013-10-30 NOTE — Telephone Encounter (Signed)
Pt needs new rx hydrocodone. Pt would like donna to return his call concerning flomax

## 2013-10-30 NOTE — Telephone Encounter (Signed)
Noted, pt's medication was changed.

## 2013-10-31 MED ORDER — HYDROCODONE-ACETAMINOPHEN 7.5-325 MG PO TABS: ORAL_TABLET | ORAL | Status: DC

## 2013-10-31 NOTE — Telephone Encounter (Signed)
Left message on voicemail to call office.  

## 2013-10-31 NOTE — Telephone Encounter (Signed)
Pt called back, has concerns about Flomax due to sulfa allergy and there is sulfa in Flomax so he has not started Rx. Pt said he was looking on Internet and wondered if Dr.K could prescribe Doxazosin for his BPH instead. Told pt Dr. Kirtland Bouchard is out of the office till Monday will check with him then and get back to you. Also told pt Rx is ready for pickup will be at the front desk.Pt verbalized understanding. Rx printed and signed.

## 2013-11-03 MED ORDER — DOXAZOSIN MESYLATE 4 MG PO TABS: 2.0000 mg | ORAL_TABLET | Freq: Every day | ORAL | Status: DC

## 2013-11-03 NOTE — Telephone Encounter (Signed)
Generic Cardura 4 mg 1 half tablet at bedtime #90, refill x2

## 2013-11-03 NOTE — Telephone Encounter (Signed)
Spoke to pt, told him new Rx for Cardura 4 mg , take half tablet (2 mg) by mouth daily was sent to pharmacy. Pt verbalized understanding.

## 2013-11-03 NOTE — Telephone Encounter (Signed)
Dr. Kirtland Bouchard, please see message below and advise about medication.

## 2013-12-04 ENCOUNTER — Telehealth: Payer: Self-pay | Admitting: Internal Medicine

## 2013-12-04 NOTE — Telephone Encounter (Signed)
Please call pt and schedule CPE for next week use two slots if needed.

## 2013-12-04 NOTE — Telephone Encounter (Signed)
Pt needs new rx hydrocodone. Pt was given only# 60  For 10 day supply. Pt take one pill every 4 hrs.

## 2013-12-04 NOTE — Telephone Encounter (Signed)
lmom for pt to cb to sch physical °

## 2013-12-05 ENCOUNTER — Other Ambulatory Visit: Payer: Self-pay

## 2013-12-05 NOTE — Telephone Encounter (Signed)
Pt is scheduled for 12/16/13 -CPX.  He would like to know if he can have a re-fill on the below medication to last him till then??

## 2013-12-08 ENCOUNTER — Encounter: Payer: Self-pay | Admitting: *Deleted

## 2013-12-08 ENCOUNTER — Ambulatory Visit (INDEPENDENT_AMBULATORY_CARE_PROVIDER_SITE_OTHER): Payer: BC Managed Care – PPO | Admitting: Internal Medicine

## 2013-12-08 ENCOUNTER — Encounter: Payer: Self-pay | Admitting: Internal Medicine

## 2013-12-08 VITALS — BP 120/80 | HR 89 | Temp 98.3°F | Resp 20 | Ht 68.0 in | Wt 162.0 lb

## 2013-12-08 DIAGNOSIS — M549 Dorsalgia, unspecified: Secondary | ICD-10-CM

## 2013-12-08 DIAGNOSIS — F411 Generalized anxiety disorder: Secondary | ICD-10-CM

## 2013-12-08 MED ORDER — HYDROCODONE-ACETAMINOPHEN 7.5-325 MG PO TABS: ORAL_TABLET | ORAL | Status: DC

## 2013-12-08 MED ORDER — ESCITALOPRAM OXALATE 10 MG PO TABS: 10.0000 mg | ORAL_TABLET | Freq: Every day | ORAL | Status: DC

## 2013-12-08 NOTE — Telephone Encounter (Signed)
appt scheduled today 

## 2013-12-08 NOTE — Telephone Encounter (Signed)
Spoke to pt, he said he was just trying to call needs to cancel physical has just got word that he starts a new job tomorrow and can not take off. He would like the Rx for Hydrocodon the same amount and he will just get by till he can come in for physical to discuss medication. Told him I will check with Dr. Kirtland BouchardK and get back to him. Pt verbalized understanding.

## 2013-12-08 NOTE — Progress Notes (Signed)
Pre visit review using our clinic review tool, if applicable. No additional management support is needed unless otherwise documented below in the visit note. 

## 2013-12-08 NOTE — Progress Notes (Signed)
Subjective:    Patient ID: Steven Wilkerson, male    Steven RiegerDOB: 02/26/1960, 53 y.o.   MRN: [id number]  HPI 53 year old patient who has a history of shoulder low back and knee pain.  He has been evaluated by orthopedics.  He has been on Vicodin at least twice daily for some time.  He does receive benefit from occasional cortisone injections by orthopedics.  He states that at times he needs the Vicodin 3 times daily and he has been running out of his 60 per month supply. He also complains of some anxiety related to a remote accident.  He states that this has been helped by Valium in the past.  Benefits, and risks of benzodiazepines discussed at History reviewed. No pertinent past medical history.  History   Social History  . Marital Status: Married    Spouse Name: N/A    Number of Children: N/A  . Years of Education: N/A   Occupational History  . Not on file.   Social History Main Topics  . Smoking status: Current Every Day Smoker -- 1.00 packs/day for 30 years    Types: Cigarettes  . Smokeless tobacco: Never Used  . Alcohol Use: No     Comment: Sober x 10 years  . Drug Use: No  . Sexual Activity: Not on file   Other Topics Concern  . Not on file   Social History Narrative  . No narrative on file    History reviewed. No pertinent past surgical history.  No family history on file.  Allergies  Allergen Reactions  . Doxazosin Other (See Comments)    Leg weakness  . Sulfa Drugs Cross Reactors Rash  . Penicillins Itching    Current Outpatient Prescriptions on File Prior to Visit  Medication Sig Dispense Refill  . Krill Oil 500 MG CAPS Take 1 capsule by mouth daily.      . meloxicam (MOBIC) 7.5 MG tablet 1 po bid with food  90 tablet  2  . Multiple Vitamin (MULTIVITAMIN) tablet Take 1 tablet by mouth daily.      . tadalafil (CIALIS) 5 MG tablet One tablet by mouth daily for BPH  90 tablet  3   No current facility-administered medications on file prior to visit.    BP  120/80  Pulse 89  Temp(Src) 98.3 F (36.8 C) (Oral)  Resp 20  Ht 5\' 8"  (1.727 m)  Wt 162 lb (73.483 kg)  BMI 24.64 kg/m2  SpO2 97%       Review of Systems  Constitutional: Negative for fever, chills, appetite change and fatigue.  HENT: Negative for congestion, dental problem, ear pain, hearing loss, sore throat, tinnitus, trouble swallowing and voice change.   Eyes: Negative for pain, discharge and visual disturbance.  Respiratory: Negative for cough, chest tightness, wheezing and stridor.   Cardiovascular: Negative for chest pain, palpitations and leg swelling.  Gastrointestinal: Negative for nausea, vomiting, abdominal pain, diarrhea, constipation, blood in stool and abdominal distention.  Genitourinary: Negative for urgency, hematuria, flank pain, discharge, difficulty urinating and genital sores.  Musculoskeletal: Positive for back pain. Negative for arthralgias (chronic knee and shoulder pain), gait problem, joint swelling, myalgias and neck stiffness.  Skin: Negative for rash.  Neurological: Negative for dizziness, syncope, speech difficulty, weakness, numbness and headaches.  Hematological: Negative for adenopathy. Does not bruise/bleed easily.  Psychiatric/Behavioral: Negative for behavioral problems and dysphoric mood. The patient is not nervous/anxious.        Objective:   Physical Exam  Constitutional: He appears well-developed and well-nourished. No distress.          Assessment & Plan:  Chronic back pain Chronic shoulder and knee pain  Patient has seen orthopedics in the past and has benefited from cortisone injections. Pain not well controlled on a twice a day regimen. Pain contract signed We'll limit Vicodin to #90 per month  Urine drug screen obtained  We'll give a trial of Lexapro and avoid benzodiazepine therapy

## 2013-12-08 NOTE — Patient Instructions (Signed)
Return as scheduled for your annual exam 

## 2013-12-08 NOTE — Telephone Encounter (Signed)
Discussed with Dr. Kirtland BouchardK, he said will not refill medication until pt is seen. Please call pt and schedule him today for follow up on medication.

## 2013-12-09 ENCOUNTER — Telehealth: Payer: Self-pay | Admitting: Internal Medicine

## 2013-12-09 NOTE — Telephone Encounter (Signed)
emmi emailed °

## 2013-12-16 ENCOUNTER — Encounter: Payer: BC Managed Care – PPO | Admitting: Internal Medicine

## 2013-12-31 ENCOUNTER — Telehealth: Payer: Self-pay | Admitting: Internal Medicine

## 2013-12-31 NOTE — Telephone Encounter (Signed)
Pt needs new rx hydrocodone. PT would like to pick up friday

## 2014-01-01 MED ORDER — HYDROCODONE-ACETAMINOPHEN 7.5-325 MG PO TABS: ORAL_TABLET | ORAL | Status: DC

## 2014-01-01 NOTE — Telephone Encounter (Signed)
Left detailed message Rx ready for pickup. Rx printed and signed. 

## 2014-01-14 ENCOUNTER — Encounter: Payer: Self-pay | Admitting: Internal Medicine

## 2014-01-19 ENCOUNTER — Telehealth: Payer: Self-pay

## 2014-01-19 MED ORDER — TADALAFIL 5 MG PO TABS: ORAL_TABLET | ORAL | Status: DC

## 2014-01-19 NOTE — Telephone Encounter (Signed)
[facility] 5 [facility]-take 1 tablet by mouth once daily for BPH.  #30.  Pls advise.

## 2014-01-19 NOTE — Telephone Encounter (Signed)
Rx sent to pharmacy   

## 2014-01-28 ENCOUNTER — Telehealth: Payer: Self-pay | Admitting: Internal Medicine

## 2014-01-28 NOTE — Telephone Encounter (Signed)
Pt request refill of the following: HYDROcodone-acetaminophen (NORCO) 7.5-325 MG per tablet,   Phamacy:

## 2014-01-30 MED ORDER — HYDROCODONE-ACETAMINOPHEN 7.5-325 MG PO TABS: ORAL_TABLET | ORAL | Status: DC

## 2014-01-30 NOTE — Telephone Encounter (Signed)
Pt notified Rx ready for pickup. Rx printed and signed by Dr. Burchette.  

## 2014-03-02 ENCOUNTER — Telehealth: Payer: Self-pay | Admitting: Internal Medicine

## 2014-03-02 NOTE — Telephone Encounter (Signed)
Pt needs new rx hydrocodone °

## 2014-03-03 MED ORDER — HYDROCODONE-ACETAMINOPHEN 7.5-325 MG PO TABS: ORAL_TABLET | ORAL | Status: DC

## 2014-03-03 NOTE — Telephone Encounter (Signed)
Pt notified Rx ready for pickup. Rx printed and signed.  

## 2014-03-30 ENCOUNTER — Telehealth: Payer: Self-pay | Admitting: Internal Medicine

## 2014-03-30 NOTE — Telephone Encounter (Signed)
Pt request refill of the following: HYDROcodone-acetaminophen (NORCO) 7.5-325 MG per tablet ° ° °Phamacy: ° °

## 2014-03-31 MED ORDER — HYDROCODONE-ACETAMINOPHEN 7.5-325 MG PO TABS: ORAL_TABLET | ORAL | Status: DC

## 2014-03-31 NOTE — Telephone Encounter (Signed)
Pt notified Rx ready for pickup. Rx printed and signed.  

## 2014-04-27 ENCOUNTER — Telehealth: Payer: Self-pay | Admitting: Internal Medicine

## 2014-04-27 NOTE — Telephone Encounter (Signed)
Pt has been sch for 04-30-14 °

## 2014-04-27 NOTE — Telephone Encounter (Signed)
Pt needs new rx hydrocodone °

## 2014-04-27 NOTE — Telephone Encounter (Signed)
Please call pt and schedule physical, pt is overdue can not have Rx till seen. May put to slots together, try to get pt in this week.

## 2014-04-28 NOTE — Telephone Encounter (Signed)
Noted  

## 2014-04-29 ENCOUNTER — Ambulatory Visit (INDEPENDENT_AMBULATORY_CARE_PROVIDER_SITE_OTHER): Payer: BLUE CROSS/BLUE SHIELD | Admitting: Internal Medicine

## 2014-04-29 ENCOUNTER — Encounter: Payer: Self-pay | Admitting: Internal Medicine

## 2014-04-29 VITALS — BP 110/70 | HR 91 | Temp 98.1°F | Resp 18 | Ht 67.0 in | Wt 157.0 lb

## 2014-04-29 DIAGNOSIS — Z0000 Encounter for general adult medical examination without abnormal findings: Secondary | ICD-10-CM

## 2014-04-29 LAB — LIPID PANEL
Cholesterol: 194 mg/dL (ref 0–200)
HDL: 42.3 mg/dL (ref >39.00)
LDL Cholesterol: 132 mg/dL — ABNORMAL HIGH (ref 0–99)
NONHDL: 151.7
TRIGLYCERIDES: 100 mg/dL (ref 0.0–149.0)
Total CHOL/HDL Ratio: 5
VLDL: 20 mg/dL (ref 0.0–40.0)

## 2014-04-29 LAB — CBC WITH DIFFERENTIAL/PLATELET
BASOS PCT: 0.4 % (ref 0.0–3.0)
Basophils Absolute: 0 10*3/uL (ref 0.0–0.1)
EOS PCT: 0.7 % (ref 0.0–5.0)
Eosinophils Absolute: 0.1 10*3/uL (ref 0.0–0.7)
HEMATOCRIT: 43.6 % (ref 39.0–52.0)
Hemoglobin: 14.8 g/dL (ref 13.0–17.0)
LYMPHS ABS: 1.8 10*3/uL (ref 0.7–4.0)
Lymphocytes Relative: 21.9 % (ref 12.0–46.0)
MCHC: 34 g/dL (ref 30.0–36.0)
MCV: 93.1 fl (ref 78.0–100.0)
Monocytes Absolute: 0.5 10*3/uL (ref 0.1–1.0)
Monocytes Relative: 6.5 % (ref 3.0–12.0)
Neutro Abs: 5.9 10*3/uL (ref 1.4–7.7)
Neutrophils Relative %: 70.5 % (ref 43.0–77.0)
Platelets: 237 10*3/uL (ref 150.0–400.0)
RBC: 4.68 Mil/uL (ref 4.22–5.81)
RDW: 12.6 % (ref 11.5–15.5)
WBC: 8.4 10*3/uL (ref 4.0–10.5)

## 2014-04-29 LAB — COMPREHENSIVE METABOLIC PANEL
ALBUMIN: 4.6 g/dL (ref 3.5–5.2)
ALT: 17 U/L (ref 0–53)
AST: 19 U/L (ref 0–37)
Alkaline Phosphatase: 64 U/L (ref 39–117)
BILIRUBIN TOTAL: 0.4 mg/dL (ref 0.2–1.2)
BUN: 15 mg/dL (ref 6–23)
CO2: 28 meq/L (ref 19–32)
CREATININE: 0.88 mg/dL (ref 0.40–1.50)
Calcium: 9.8 mg/dL (ref 8.4–10.5)
Chloride: 106 mEq/L (ref 96–112)
GFR: 95.38 mL/min (ref >60.00)
GLUCOSE: 99 mg/dL (ref 70–99)
Potassium: 4.5 mEq/L (ref 3.5–5.1)
Sodium: 139 mEq/L (ref 135–145)
TOTAL PROTEIN: 7 g/dL (ref 6.0–8.3)

## 2014-04-29 LAB — PSA: PSA: 3.27 ng/mL (ref 0.10–4.00)

## 2014-04-29 LAB — TSH: TSH: 0.9 u[IU]/mL (ref 0.35–4.50)

## 2014-04-29 MED ORDER — HYDROCODONE-ACETAMINOPHEN 7.5-325 MG PO TABS: ORAL_TABLET | ORAL | Status: DC

## 2014-04-29 MED ORDER — DIAZEPAM 5 MG PO TABS: 5.0000 mg | ORAL_TABLET | Freq: Two times a day (BID) | ORAL | Status: DC | PRN

## 2014-04-29 NOTE — Progress Notes (Signed)
Pre visit review using our clinic review tool, if applicable. No additional management support is needed unless otherwise documented below in the visit note. 

## 2014-04-29 NOTE — Patient Instructions (Signed)
Smoking tobacco is very bad for your health. You should stop smoking immediately.    It is important that you exercise regularly, at least 20 minutes 3 to 4 times per week.  If you develop chest pain or shortness of breath seek  medical attention.  Schedule your colonoscopy to help detect colon cancer.  Preventive Care for Adults A healthy lifestyle and preventive care can promote health and wellness. Preventive health guidelines for men include the following key practices:  A routine yearly physical is a good way to check with your health care provider about your health and preventative screening. It is a chance to share any concerns and updates on your health and to receive a thorough exam.  Visit your dentist for a routine exam and preventative care every 6 months. Brush your teeth twice a day and floss once a day. Good oral hygiene prevents tooth decay and gum disease.  The frequency of eye exams is based on your age, health, family medical history, use of contact lenses, and other factors. Follow your health care provider's recommendations for frequency of eye exams.  Eat a healthy diet. Foods such as vegetables, fruits, whole grains, low-fat dairy products, and lean protein foods contain the nutrients you need without too many calories. Decrease your intake of foods high in solid fats, added sugars, and salt. Eat the right amount of calories for you.Get information about a proper diet from your health care provider, if necessary.  Regular physical exercise is one of the most important things you can do for your health. Most adults should get at least 150 minutes of moderate-intensity exercise (any activity that increases your heart rate and causes you to sweat) each week. In addition, most adults need muscle-strengthening exercises on 2 or more days a week.  Maintain a healthy weight. The body mass index (BMI) is a screening tool to identify possible weight problems. It provides an estimate  of body fat based on height and weight. Your health care provider can find your BMI and can help you achieve or maintain a healthy weight.For adults 20 years and older:  A BMI below 18.5 is considered underweight.  A BMI of 18.5 to 24.9 is normal.  A BMI of 25 to 29.9 is considered overweight.  A BMI of 30 and above is considered obese.  Maintain normal blood lipids and cholesterol levels by exercising and minimizing your intake of saturated fat. Eat a balanced diet with plenty of fruit and vegetables. Blood tests for lipids and cholesterol should begin at age 76 and be repeated every 5 years. If your lipid or cholesterol levels are high, you are over 50, or you are at high risk for heart disease, you may need your cholesterol levels checked more frequently.Ongoing high lipid and cholesterol levels should be treated with medicines if diet and exercise are not working.  If you smoke, find out from your health care provider how to quit. If you do not use tobacco, do not start.  Lung cancer screening is recommended for adults aged 88-80 years who are at high risk for developing lung cancer because of a history of smoking. A yearly low-dose CT scan of the lungs is recommended for people who have at least a 30-pack-year history of smoking and are a current smoker or have quit within the past 15 years. A pack year of smoking is smoking an average of 1 pack of cigarettes a day for 1 year (for example: 1 pack a day for 30  years or 2 packs a day for 15 years). Yearly screening should continue until the smoker has stopped smoking for at least 15 years. Yearly screening should be stopped for people who develop a health problem that would prevent them from having lung cancer treatment.  If you choose to drink alcohol, do not have more than 2 drinks per day. One drink is considered to be 12 ounces (355 mL) of beer, 5 ounces (148 mL) of wine, or 1.5 ounces (44 mL) of liquor.  Avoid use of street drugs. Do not  share needles with anyone. Ask for help if you need support or instructions about stopping the use of drugs.  High blood pressure causes heart disease and increases the risk of stroke. Your blood pressure should be checked at least every 1-2 years. Ongoing high blood pressure should be treated with medicines, if weight loss and exercise are not effective.  If you are 29-21 years old, ask your health care provider if you should take aspirin to prevent heart disease.  Diabetes screening involves taking a blood sample to check your fasting blood sugar level. This should be done once every 3 years, after age 37, if you are within normal weight and without risk factors for diabetes. Testing should be considered at a younger age or be carried out more frequently if you are overweight and have at least 1 risk factor for diabetes.  Colorectal cancer can be detected and often prevented. Most routine colorectal cancer screening begins at the age of 67 and continues through age 78. However, your health care provider may recommend screening at an earlier age if you have risk factors for colon cancer. On a yearly basis, your health care provider may provide home test kits to check for hidden blood in the stool. Use of a small camera at the end of a tube to directly examine the colon (sigmoidoscopy or colonoscopy) can detect the earliest forms of colorectal cancer. Talk to your health care provider about this at age 72, when routine screening begins. Direct exam of the colon should be repeated every 5-10 years through age 67, unless early forms of precancerous polyps or small growths are found.  People who are at an increased risk for hepatitis B should be screened for this virus. You are considered at high risk for hepatitis B if:  You were born in a country where hepatitis B occurs often. Talk with your health care provider about which countries are considered high risk.  Your parents were born in a high-risk  country and you have not received a shot to protect against hepatitis B (hepatitis B vaccine).  You have HIV or AIDS.  You use needles to inject street drugs.  You live with, or have sex with, someone who has hepatitis B.  You are a man who has sex with other men (MSM).  You get hemodialysis treatment.  You take certain medicines for conditions such as cancer, organ transplantation, and autoimmune conditions.  Hepatitis C blood testing is recommended for all people born from [birth year] through 1965 and any individual with known risks for hepatitis C.  Practice safe sex. Use condoms and avoid high-risk sexual practices to reduce the spread of sexually transmitted infections (STIs). STIs include gonorrhea, chlamydia, syphilis, trichomonas, herpes, HPV, and human immunodeficiency virus (HIV). Herpes, HIV, and HPV are viral illnesses that have no cure. They can result in disability, cancer, and death.  If you are at risk of being infected with HIV, it is recommended  that you take a prescription medicine daily to prevent HIV infection. This is called preexposure prophylaxis (PrEP). You are considered at risk if:  You are a man who has sex with other men (MSM) and have other risk factors.  You are a heterosexual man, are sexually active, and are at increased risk for HIV infection.  You take drugs by injection.  You are sexually active with a partner who has HIV.  Talk with your health care provider about whether you are at high risk of being infected with HIV. If you choose to begin PrEP, you should first be tested for HIV. You should then be tested every 3 months for as long as you are taking PrEP.  A one-time screening for abdominal aortic aneurysm (AAA) and surgical repair of large AAAs by ultrasound are recommended for men ages 60 to 39 years who are current or former smokers.  Healthy men should no longer receive prostate-specific antigen (PSA) blood tests as part of routine cancer  screening. Talk with your health care provider about prostate cancer screening.  Testicular cancer screening is not recommended for adult males who have no symptoms. Screening includes self-exam, a health care provider exam, and other screening tests. Consult with your health care provider about any symptoms you have or any concerns you have about testicular cancer.  Use sunscreen. Apply sunscreen liberally and repeatedly throughout the day. You should seek shade when your shadow is shorter than you. Protect yourself by wearing long sleeves, pants, a wide-brimmed hat, and sunglasses year round, whenever you are outdoors.  Once a month, do a whole-body skin exam, using a mirror to look at the skin on your back. Tell your health care provider about new moles, moles that have irregular borders, moles that are larger than a pencil eraser, or moles that have changed in shape or color.  Stay current with required vaccines (immunizations).  Influenza vaccine. All adults should be immunized every year.  Tetanus, diphtheria, and acellular pertussis (Td, Tdap) vaccine. An adult who has not previously received Tdap or who does not know his vaccine status should receive 1 dose of Tdap. This initial dose should be followed by tetanus and diphtheria toxoids (Td) booster doses every 10 years. Adults with an unknown or incomplete history of completing a 3-dose immunization series with Td-containing vaccines should begin or complete a primary immunization series including a Tdap dose. Adults should receive a Td booster every 10 years.  Varicella vaccine. An adult without evidence of immunity to varicella should receive 2 doses or a second dose if he has previously received 1 dose.  Human papillomavirus (HPV) vaccine. Males aged 17-21 years who have not received the vaccine previously should receive the 3-dose series. Males aged 22-26 years may be immunized. Immunization is recommended through the age of 51 years for  any male who has sex with males and did not get any or all doses earlier. Immunization is recommended for any person with an immunocompromised condition through the age of 40 years if he did not get any or all doses earlier. During the 3-dose series, the second dose should be obtained 4-8 weeks after the first dose. The third dose should be obtained 24 weeks after the first dose and 16 weeks after the second dose.  Zoster vaccine. One dose is recommended for adults aged 9 years or older unless certain conditions are present.  Measles, mumps, and rubella (MMR) vaccine. Adults born before [birth year] generally are considered immune to measles and mumps.  Adults born in [birth year] or later should have 1 or more doses of MMR vaccine unless there is a contraindication to the vaccine or there is laboratory evidence of immunity to each of the three diseases. A routine second dose of MMR vaccine should be obtained at least 28 days after the first dose for students attending postsecondary schools, health care workers, or international travelers. People who received inactivated measles vaccine or an unknown type of measles vaccine during 1963-1967 should receive 2 doses of MMR vaccine. People who received inactivated mumps vaccine or an unknown type of mumps vaccine before 1979 and are at high risk for mumps infection should consider immunization with 2 doses of MMR vaccine. Unvaccinated health care workers born before [birth year] who lack laboratory evidence of measles, mumps, or rubella immunity or laboratory confirmation of disease should consider measles and mumps immunization with 2 doses of MMR vaccine or rubella immunization with 1 dose of MMR vaccine.  Pneumococcal 13-valent conjugate (PCV13) vaccine. When indicated, a person who is uncertain of his immunization history and has no record of immunization should receive the PCV13 vaccine. An adult aged 80 years or older who has certain medical conditions and has not been previously  immunized should receive 1 dose of PCV13 vaccine. This PCV13 should be followed with a dose of pneumococcal polysaccharide (PPSV23) vaccine. The PPSV23 vaccine dose should be obtained at least 8 weeks after the dose of PCV13 vaccine. An adult aged 29 years or older who has certain medical conditions and previously received 1 or more doses of PPSV23 vaccine should receive 1 dose of PCV13. The PCV13 vaccine dose should be obtained 1 or more years after the last PPSV23 vaccine dose.  Pneumococcal polysaccharide (PPSV23) vaccine. When PCV13 is also indicated, PCV13 should be obtained first. All adults aged 40 years and older should be immunized. An adult younger than age 85 years who has certain medical conditions should be immunized. Any person who resides in a nursing home or long-term care facility should be immunized. An adult smoker should be immunized. People with an immunocompromised condition and certain other conditions should receive both PCV13 and PPSV23 vaccines. People with human immunodeficiency virus (HIV) infection should be immunized as soon as possible after diagnosis. Immunization during chemotherapy or radiation therapy should be avoided. Routine use of PPSV23 vaccine is not recommended for American Indians, [place] Natives, or people younger than 65 years unless there are medical conditions that require PPSV23 vaccine. When indicated, people who have unknown immunization and have no record of immunization should receive PPSV23 vaccine. One-time revaccination 5 years after the first dose of PPSV23 is recommended for people aged 19-64 years who have chronic kidney failure, nephrotic syndrome, asplenia, or immunocompromised conditions. People who received 1-2 doses of PPSV23 before age 62 years should receive another dose of PPSV23 vaccine at age 71 years or later if at least 5 years have passed since the previous dose. Doses of PPSV23 are not needed for people immunized with PPSV23 at or after age 62  years.  Meningococcal vaccine. Adults with asplenia or persistent complement component deficiencies should receive 2 doses of quadrivalent meningococcal conjugate (MenACWY-D) vaccine. The doses should be obtained at least 2 months apart. Microbiologists working with certain meningococcal bacteria, [employer] recruits, people at risk during an outbreak, and people who travel to or live in countries with a high rate of meningitis should be immunized. A first-year college student up through age 74 years who is living in a residence hall should receive  a dose if he did not receive a dose on or after his 16th birthday. Adults who have certain high-risk conditions should receive one or more doses of vaccine.  Hepatitis A vaccine. Adults who wish to be protected from this disease, have certain high-risk conditions, work with hepatitis A-infected animals, work in hepatitis A research labs, or travel to or work in countries with a high rate of hepatitis A should be immunized. Adults who were previously unvaccinated and who anticipate close contact with an international adoptee during the first 60 days after arrival in the [place] from a country with a high rate of hepatitis A should be immunized.  Hepatitis B vaccine. Adults should be immunized if they wish to be protected from this disease, have certain high-risk conditions, may be exposed to blood or other infectious body fluids, are household contacts or sex partners of hepatitis B positive people, are clients or workers in certain care facilities, or travel to or work in countries with a high rate of hepatitis B.  Haemophilus influenzae type b (Hib) vaccine. A previously unvaccinated person with asplenia or sickle cell disease or having a scheduled splenectomy should receive 1 dose of Hib vaccine. Regardless of previous immunization, a recipient of a hematopoietic stem cell transplant should receive a 3-dose series 6-12 months after his successful transplant.  Hib vaccine is not recommended for adults with HIV infection. Preventive Service / Frequency Ages 97 to 64  Blood pressure check.** / Every 1 to 2 years.  Lipid and cholesterol check.** / Every 5 years beginning at age 56.  Hepatitis C blood test.** / For any individual with known risks for hepatitis C.  Skin self-exam. / Monthly.  Influenza vaccine. / Every year.  Tetanus, diphtheria, and acellular pertussis (Tdap, Td) vaccine.** / Consult your health care provider. 1 dose of Td every 10 years.  Varicella vaccine.** / Consult your health care provider.  HPV vaccine. / 3 doses over 6 months, if 83 or younger.  Measles, mumps, rubella (MMR) vaccine.** / You need at least 1 dose of MMR if you were born in 1959 or later. You may also need a second dose.  Pneumococcal 13-valent conjugate (PCV13) vaccine.** / Consult your health care provider.  Pneumococcal polysaccharide (PPSV23) vaccine.** / 1 to 2 doses if you smoke cigarettes or if you have certain conditions.  Meningococcal vaccine.** / 1 dose if you are age 33 to 25 years and a Market researcher living in a residence hall, or have one of several medical conditions. You may also need additional booster doses.  Hepatitis A vaccine.** / Consult your health care provider.  Hepatitis B vaccine.** / Consult your health care provider.  Haemophilus influenzae type b (Hib) vaccine.** / Consult your health care provider. Ages 59 to 27  Blood pressure check.** / Every 1 to 2 years.  Lipid and cholesterol check.** / Every 5 years beginning at age 55.  Lung cancer screening. / Every year if you are aged 62-80 years and have a 30-pack-year history of smoking and currently smoke or have quit within the past 15 years. Yearly screening is stopped once you have quit smoking for at least 15 years or develop a health problem that would prevent you from having lung cancer treatment.  Fecal occult blood test (FOBT) of stool. / Every year  beginning at age 26 and continuing until age 62. You may not have to do this test if you get a colonoscopy every 10 years.  Flexible sigmoidoscopy** or  colonoscopy.** / Every 5 years for a flexible sigmoidoscopy or every 10 years for a colonoscopy beginning at age 9 and continuing until age 35.  Hepatitis C blood test.** / For all people born from [birth year] through 1967 and any individual with known risks for hepatitis C.  Skin self-exam. / Monthly.  Influenza vaccine. / Every year.  Tetanus, diphtheria, and acellular pertussis (Tdap/Td) vaccine.** / Consult your health care provider. 1 dose of Td every 10 years.  Varicella vaccine.** / Consult your health care provider.  Zoster vaccine.** / 1 dose for adults aged 43 years or older.  Measles, mumps, rubella (MMR) vaccine.** / You need at least 1 dose of MMR if you were born in 1959 or later. You may also need a second dose.  Pneumococcal 13-valent conjugate (PCV13) vaccine.** / Consult your health care provider.  Pneumococcal polysaccharide (PPSV23) vaccine.** / 1 to 2 doses if you smoke cigarettes or if you have certain conditions.  Meningococcal vaccine.** / Consult your health care provider.  Hepatitis A vaccine.** / Consult your health care provider.  Hepatitis B vaccine.** / Consult your health care provider.  Haemophilus influenzae type b (Hib) vaccine.** / Consult your health care provider. Ages 15 and over  Blood pressure check.** / Every 1 to 2 years.  Lipid and cholesterol check.**/ Every 5 years beginning at age 20.  Lung cancer screening. / Every year if you are aged 40-80 years and have a 30-pack-year history of smoking and currently smoke or have quit within the past 15 years. Yearly screening is stopped once you have quit smoking for at least 15 years or develop a health problem that would prevent you from having lung cancer treatment.  Fecal occult blood test (FOBT) of stool. / Every year beginning at age 64 and  continuing until age 15. You may not have to do this test if you get a colonoscopy every 10 years.  Flexible sigmoidoscopy** or colonoscopy.** / Every 5 years for a flexible sigmoidoscopy or every 10 years for a colonoscopy beginning at age 90 and continuing until age 23.  Hepatitis C blood test.** / For all people born from [birth year] through 1967 and any individual with known risks for hepatitis C.  Abdominal aortic aneurysm (AAA) screening.** / A one-time screening for ages 41 to 69 years who are current or former smokers.  Skin self-exam. / Monthly.  Influenza vaccine. / Every year.  Tetanus, diphtheria, and acellular pertussis (Tdap/Td) vaccine.** / 1 dose of Td every 10 years.  Varicella vaccine.** / Consult your health care provider.  Zoster vaccine.** / 1 dose for adults aged 53 years or older.  Pneumococcal 13-valent conjugate (PCV13) vaccine.** / Consult your health care provider.  Pneumococcal polysaccharide (PPSV23) vaccine.** / 1 dose for all adults aged 65 years and older.  Meningococcal vaccine.** / Consult your health care provider.  Hepatitis A vaccine.** / Consult your health care provider.  Hepatitis B vaccine.** / Consult your health care provider.  Haemophilus influenzae type b (Hib) vaccine.** / Consult your health care provider. **Family history and personal history of risk and conditions may change your health care provider's recommendations. Document Released: 04/04/2001 Document Revised: 02/11/2013 Document Reviewed: 07/04/2010 [facility] Patient Information 2015 [place], [place]. This information is not intended to replace advice given to you by your health care provider. Make sure you discuss any questions you have with your health care provider.

## 2014-04-29 NOTE — Progress Notes (Signed)
Subjective:    Patient ID: Steven Wilkerson, male    DOB: [birth year], 54 y.o.   MRN: [id number]  HPI  54 year old patient who is seen for a preventive health examination.  He is followed by orthopedics due to back and shoulder discomfort. He has a history of an anxiety disorder and was placed on SSRI back in the fall.  This was discontinued due to headaches and somnolence. He takes when necessary analgesics due to his back and shoulder pain.  He does require episodic cortisone injections for the shoulder. Is requesting a refill on diazepam for his anxiety disorder.  Behavioral health referral.  Discussed and patient declines.  Family history father died at 66.  Mother died at 25 from complications of ovarian cancer.  4 siblings.  One with a history of a cerebral aneurysm  No past medical history on file.  History   Social History  . Marital Status: Married    Spouse Name: N/A  . Number of Children: N/A  . Years of Education: N/A   Occupational History  . Not on file.   Social History Main Topics  . Smoking status: Current Every Day Smoker -- 1.00 packs/day for 30 years    Types: Cigarettes  . Smokeless tobacco: Never Used  . Alcohol Use: No     Comment: Sober x 10 years  . Drug Use: No  . Sexual Activity: Not on file   Other Topics Concern  . Not on file   Social History Narrative    No past surgical history on file.  No family history on file.  Allergies  Allergen Reactions  . Doxazosin Other (See Comments)    Leg weakness  . Sulfa Drugs Cross Reactors Rash  . Penicillins Itching    Current Outpatient Prescriptions on File Prior to Visit  Medication Sig Dispense Refill  . Krill Oil 500 MG CAPS Take 1 capsule by mouth daily.    . meloxicam (MOBIC) 7.5 MG tablet 1 po bid with food 90 tablet 2  . Methylsulfonylmethane (MSM) POWD Take 1 scoop by mouth daily.    . Multiple Vitamin (MULTIVITAMIN) tablet Take 1 tablet by mouth daily.    . tadalafil (CIALIS) 5 MG  tablet One tablet by mouth daily for BPH 30 tablet 2   No current facility-administered medications on file prior to visit.    BP 110/70 mmHg  Pulse 91  Temp(Src) 98.1 F (36.7 C) (Oral)  Resp 18  Ht  (1.702 m)  Wt 157 lb (71.215 kg)  BMI 24.58 kg/m2  SpO2 98%     Review of Systems  Constitutional: Negative for fever, chills, appetite change and fatigue.  HENT: Negative for congestion, dental problem, ear pain, hearing loss, sore throat, tinnitus, trouble swallowing and voice change.   Eyes: Negative for pain, discharge and visual disturbance.  Respiratory: Negative for cough, chest tightness, wheezing and stridor.   Cardiovascular: Negative for chest pain, palpitations and leg swelling.  Gastrointestinal: Negative for nausea, vomiting, abdominal pain, diarrhea, constipation, blood in stool and abdominal distention.  Genitourinary: Negative for urgency, hematuria, flank pain, discharge, difficulty urinating and genital sores.  Musculoskeletal: Positive for back pain. Negative for myalgias, joint swelling, arthralgias, gait problem and neck stiffness.  Skin: Negative for rash.  Neurological: Negative for dizziness, syncope, speech difficulty, weakness, numbness and headaches.  Hematological: Negative for adenopathy. Does not bruise/bleed easily.  Psychiatric/Behavioral: Negative for behavioral problems and dysphoric mood. The patient is nervous/anxious.  Objective:   Physical Exam  Constitutional: He appears well-developed and well-nourished.  HENT:  Head: Normocephalic and atraumatic.  Right Ear: External ear normal.  Left Ear: External ear normal.  Nose: Nose normal.  Mouth/Throat: Oropharynx is clear and moist.  Eyes: Conjunctivae and EOM are normal. Pupils are equal, round, and reactive to light. No scleral icterus.  Neck: Normal range of motion. Neck supple. No JVD present. No thyromegaly present.  Cardiovascular: Regular rhythm, normal heart sounds and  intact distal pulses.  Exam reveals no gallop and no friction rub.   No murmur heard. Pulmonary/Chest: Effort normal and breath sounds normal. He exhibits no tenderness.  Abdominal: Soft. Bowel sounds are normal. He exhibits no distension and no mass. There is no tenderness.  Genitourinary: Prostate normal and penis normal.  Musculoskeletal: Normal range of motion. He exhibits no edema or tenderness.  Lymphadenopathy:    He has no cervical adenopathy.  Neurological: He is alert. He has normal reflexes. No cranial nerve deficit. Coordination normal.  Skin: Skin is warm and dry. No rash noted.  Psychiatric: He has a normal mood and affect. His behavior is normal.          Assessment & Plan:  Preventive health examination Low back pain Anxiety disorder Tobacco use.  Total smoking cessation encouraged.  He has cut down his consumption by 50%  Colonoscopy.  Encouraged Recheck one year or as needed

## 2014-04-30 ENCOUNTER — Encounter: Payer: Self-pay | Admitting: Internal Medicine

## 2014-05-25 ENCOUNTER — Telehealth: Payer: Self-pay | Admitting: Internal Medicine

## 2014-05-25 NOTE — Telephone Encounter (Signed)
Pt request refill HYDROcodone-acetaminophen (NORCO) 7.5-325 MG per tablet °

## 2014-05-26 MED ORDER — HYDROCODONE-ACETAMINOPHEN 7.5-325 MG PO TABS: ORAL_TABLET | ORAL | Status: DC

## 2014-05-26 NOTE — Telephone Encounter (Addendum)
Pt would like to know if he can pu rx today? Pt 's son's rx from dr fry will be ready and he would like to  Make one trip from  Pt states he is not out,  Ok to post date if he can pu

## 2014-05-26 NOTE — Telephone Encounter (Signed)
Pt notified Rx ready for pickup. Rx printed and signed.  

## 2014-06-18 ENCOUNTER — Telehealth: Payer: Self-pay | Admitting: Internal Medicine

## 2014-06-18 NOTE — Telephone Encounter (Signed)
Pt request refill of the following: HYDROcodone-acetaminophen (NORCO) 7.5-325 MG per tablet ° ° °Phamacy: ° °

## 2014-06-18 NOTE — Telephone Encounter (Signed)
Spoke to pt, told him Dr.K will be back on Monday and I will have Rx ready then. Pt verbalized understanding.

## 2014-06-22 MED ORDER — HYDROCODONE-ACETAMINOPHEN 7.5-325 MG PO TABS: ORAL_TABLET | ORAL | Status: DC

## 2014-06-22 NOTE — Addendum Note (Signed)
Addended by: Jimmye NormanPHANOS, Christan Defranco J on: 06/22/2014 08:13 AM   Modules accepted: Orders

## 2014-06-22 NOTE — Telephone Encounter (Signed)
Left detailed message Rx ready for pickup. Rx printed and signed by Dr.K. 

## 2014-07-01 ENCOUNTER — Other Ambulatory Visit: Payer: Self-pay | Admitting: Internal Medicine

## 2014-07-02 NOTE — Telephone Encounter (Signed)
Dr. Kirtland BouchardK, okay to refill  Valium?

## 2014-07-03 NOTE — Telephone Encounter (Signed)
ok 

## 2014-07-28 ENCOUNTER — Telehealth: Payer: Self-pay | Admitting: Internal Medicine

## 2014-07-28 MED ORDER — HYDROCODONE-ACETAMINOPHEN 7.5-325 MG PO TABS: ORAL_TABLET | ORAL | Status: DC

## 2014-07-28 NOTE — Telephone Encounter (Signed)
Pt notified Rx ready for pickup. Rx printed and signed.  

## 2014-07-28 NOTE — Telephone Encounter (Signed)
Pt request refill of the following: HYDROcodone-acetaminophen (NORCO) 7.5-325 MG per tablet ° ° °Phamacy: ° °

## 2014-08-21 ENCOUNTER — Encounter: Payer: Self-pay | Admitting: Internal Medicine

## 2014-09-01 ENCOUNTER — Telehealth: Payer: Self-pay | Admitting: Internal Medicine

## 2014-09-01 NOTE — Telephone Encounter (Signed)
Pt needs new rx hydrocodone °

## 2014-09-02 MED ORDER — HYDROCODONE-ACETAMINOPHEN 7.5-325 MG PO TABS: ORAL_TABLET | ORAL | Status: DC

## 2014-09-02 NOTE — Telephone Encounter (Signed)
Pt notified Rx ready for pickup. Rx printed and signed.  

## 2014-09-28 ENCOUNTER — Telehealth: Payer: Self-pay | Admitting: Family Medicine

## 2014-09-28 NOTE — Telephone Encounter (Signed)
Pt not on medication

## 2014-09-28 NOTE — Telephone Encounter (Signed)
Refill request for Doxazosin 4 mg and a 90 day supply to [facility].

## 2014-10-01 ENCOUNTER — Other Ambulatory Visit: Payer: Self-pay | Admitting: Internal Medicine

## 2014-10-05 ENCOUNTER — Telehealth: Payer: Self-pay | Admitting: Internal Medicine

## 2014-10-05 MED ORDER — HYDROCODONE-ACETAMINOPHEN 7.5-325 MG PO TABS: ORAL_TABLET | ORAL | Status: DC

## 2014-10-05 NOTE — Telephone Encounter (Signed)
Pt request refill of the following: HYDROcodone-acetaminophen (NORCO) 7.5-325 MG per tablet ° ° °Phamacy: ° °

## 2014-10-05 NOTE — Telephone Encounter (Signed)
Pt notified Rx ready for pickup. Rx printed and signed.  

## 2014-10-13 ENCOUNTER — Telehealth: Payer: Self-pay | Admitting: *Deleted

## 2014-10-13 NOTE — Telephone Encounter (Signed)
Rx was refilled on 8/15 to [facility]. Rx denied to [facility].

## 2014-10-13 NOTE — Telephone Encounter (Signed)
Patient is requesting a 90 refill of diazepam (VALIUM) 5 MG tablet [facility]

## 2014-10-30 ENCOUNTER — Telehealth: Payer: Self-pay | Admitting: Internal Medicine

## 2014-10-30 NOTE — Telephone Encounter (Addendum)
Pt needs new hydrocodone. Pt needs rx next week. Pt would like to pick up rx when he pick up his son rx

## 2014-11-02 MED ORDER — HYDROCODONE-ACETAMINOPHEN 7.5-325 MG PO TABS: ORAL_TABLET | ORAL | Status: DC

## 2014-11-02 NOTE — Telephone Encounter (Signed)
Pt notified Rx ready for pickup. Rx printed and signed.  

## 2014-11-03 ENCOUNTER — Other Ambulatory Visit: Payer: Self-pay | Admitting: Family Medicine

## 2014-11-03 NOTE — Telephone Encounter (Signed)
This medication is not on the patient's current active medication list.

## 2014-11-14 ENCOUNTER — Other Ambulatory Visit: Payer: Self-pay | Admitting: Internal Medicine

## 2014-11-25 ENCOUNTER — Other Ambulatory Visit: Payer: Self-pay | Admitting: Internal Medicine

## 2014-11-25 NOTE — Telephone Encounter (Signed)
Dr.K, okay to fill Hydrocodone?

## 2014-11-25 NOTE — Telephone Encounter (Signed)
Pt needs new rx hydrocodone. Pt is not out of med. Pt is just trying not to make two trips. Pt will be picking up his son rx this week. Pt son sees dr fry

## 2014-11-27 MED ORDER — HYDROCODONE-ACETAMINOPHEN 7.5-325 MG PO TABS: ORAL_TABLET | ORAL | Status: DC

## 2014-11-27 NOTE — Telephone Encounter (Signed)
OK 

## 2014-11-27 NOTE — Telephone Encounter (Signed)
Called and spoke with pt and pt is aware.  

## 2015-01-01 ENCOUNTER — Other Ambulatory Visit: Payer: Self-pay | Admitting: *Deleted

## 2015-01-01 MED ORDER — TADALAFIL 5 MG PO TABS: ORAL_TABLET | ORAL | Status: DC

## 2015-01-04 ENCOUNTER — Telehealth: Payer: Self-pay | Admitting: Internal Medicine

## 2015-01-04 NOTE — Telephone Encounter (Signed)
Pt request refill  °HYDROcodone-acetaminophen (NORCO) 7.5-325 MG tablet °

## 2015-01-05 MED ORDER — HYDROCODONE-ACETAMINOPHEN 7.5-325 MG PO TABS: ORAL_TABLET | ORAL | Status: DC

## 2015-01-05 NOTE — Telephone Encounter (Signed)
Pt notified Rx ready for pickup. Rx printed and signed by Dr. Todd.  

## 2015-01-08 ENCOUNTER — Encounter: Payer: Self-pay | Admitting: Internal Medicine

## 2015-01-08 MED ORDER — TADALAFIL 5 MG PO TABS: ORAL_TABLET | ORAL | Status: DC

## 2015-01-20 ENCOUNTER — Other Ambulatory Visit: Payer: Self-pay | Admitting: Internal Medicine

## 2015-02-12 ENCOUNTER — Telehealth: Payer: Self-pay | Admitting: Internal Medicine

## 2015-02-12 MED ORDER — HYDROCODONE-ACETAMINOPHEN 7.5-325 MG PO TABS: ORAL_TABLET | ORAL | Status: DC

## 2015-02-12 NOTE — Telephone Encounter (Signed)
Pt notified Rx ready for pickup. Rx printed and signed.  

## 2015-02-12 NOTE — Telephone Encounter (Signed)
Mr. Steven Wilkerson called saying he's coming to [place] to pick up his son's medication and he's wondering if he can pick up a Rx for Hydrocodone for himself. He's not out of the medication but [place] he said it would be easier if he could pick up the Rx's for his son and himself today. He'd like a phone call regarding this either way.  Pt's ph# [phone] Thank you.

## 2015-03-15 ENCOUNTER — Telehealth: Payer: Self-pay | Admitting: Internal Medicine

## 2015-03-15 MED ORDER — HYDROCODONE-ACETAMINOPHEN 7.5-325 MG PO TABS: ORAL_TABLET | ORAL | Status: DC

## 2015-03-15 NOTE — Telephone Encounter (Signed)
Pt notified Rx ready for pickup. Rx printed and signed.  

## 2015-03-15 NOTE — Telephone Encounter (Signed)
Pt needs new rx hydrocodone °

## 2015-04-19 ENCOUNTER — Telehealth: Payer: Self-pay | Admitting: Internal Medicine

## 2015-04-19 MED ORDER — HYDROCODONE-ACETAMINOPHEN 7.5-325 MG PO TABS: ORAL_TABLET | ORAL | Status: DC

## 2015-04-19 NOTE — Telephone Encounter (Signed)
° ° °  Pt request refill of the following: ° °HYDROcodone-acetaminophen (NORCO) 7.5-325 MG tablet ° ° °Phamacy: °

## 2015-04-19 NOTE — Telephone Encounter (Signed)
Pt notified Rx ready for pickup. Rx printed and signed.  

## 2015-04-29 ENCOUNTER — Other Ambulatory Visit: Payer: Self-pay | Admitting: Internal Medicine

## 2015-05-31 ENCOUNTER — Telehealth: Payer: Self-pay | Admitting: Internal Medicine

## 2015-05-31 MED ORDER — HYDROCODONE-ACETAMINOPHEN 7.5-325 MG PO TABS: ORAL_TABLET | ORAL | Status: DC

## 2015-05-31 NOTE — Telephone Encounter (Signed)
Pt needs new rx hydrocodone °

## 2015-05-31 NOTE — Telephone Encounter (Signed)
Pt notified Rx ready for pickup. Rx printed and signed.  

## 2015-07-01 ENCOUNTER — Telehealth: Payer: Self-pay | Admitting: Internal Medicine

## 2015-07-01 NOTE — Telephone Encounter (Signed)
Pt request refill  °HYDROcodone-acetaminophen (NORCO) 7.5-325 MG tablet °

## 2015-07-02 MED ORDER — HYDROCODONE-ACETAMINOPHEN 7.5-325 MG PO TABS: ORAL_TABLET | ORAL | Status: DC

## 2015-07-02 NOTE — Telephone Encounter (Signed)
Left message on voicemail Rx ready for pickup, will be at the front desk. Rx printed and signed. 

## 2015-08-02 ENCOUNTER — Telehealth: Payer: Self-pay | Admitting: Internal Medicine

## 2015-08-02 MED ORDER — HYDROCODONE-ACETAMINOPHEN 7.5-325 MG PO TABS: ORAL_TABLET | ORAL | Status: DC

## 2015-08-02 NOTE — Telephone Encounter (Signed)
Pt needs new rx hydrocodone °

## 2015-08-02 NOTE — Telephone Encounter (Signed)
Pt notified Rx ready for pickup. Rx printed and signed.  

## 2015-08-06 ENCOUNTER — Other Ambulatory Visit: Payer: Self-pay | Admitting: Internal Medicine

## 2015-08-27 ENCOUNTER — Other Ambulatory Visit: Payer: Self-pay | Admitting: Internal Medicine

## 2015-09-01 ENCOUNTER — Other Ambulatory Visit: Payer: Self-pay | Admitting: Internal Medicine

## 2015-09-01 NOTE — Telephone Encounter (Signed)
Pt need new Rx for Hydrocodone °

## 2015-09-03 MED ORDER — HYDROCODONE-ACETAMINOPHEN 7.5-325 MG PO TABS: ORAL_TABLET | ORAL | Status: DC

## 2015-09-03 NOTE — Telephone Encounter (Signed)
Rx printed and put up front. 

## 2015-10-07 ENCOUNTER — Telehealth: Payer: Self-pay | Admitting: Internal Medicine

## 2015-10-07 NOTE — Telephone Encounter (Signed)
Pt request refill  °HYDROcodone-acetaminophen (NORCO) 7.5-325 MG tablet °

## 2015-10-08 MED ORDER — HYDROCODONE-ACETAMINOPHEN 7.5-325 MG PO TABS: ORAL_TABLET | ORAL | 0 refills | Status: DC

## 2015-10-08 NOTE — Telephone Encounter (Signed)
Okay for refill Schedule office visit for follow-up

## 2015-10-08 NOTE — Telephone Encounter (Signed)
Informed patient that Rx is up front for pick up and to schedule appointment for f/u with Dr. Kirtland BouchardK then.

## 2015-11-16 ENCOUNTER — Telehealth: Payer: Self-pay | Admitting: Internal Medicine

## 2015-11-16 NOTE — Telephone Encounter (Signed)
Pt request refill  HYDROcodone-acetaminophen (NORCO) 7.5-325 MG tablet  Pt aware needs to make appointment, however has started a new job this week and will call back to schedule.  Would like to pick up tomorrow.

## 2015-11-17 MED ORDER — HYDROCODONE-ACETAMINOPHEN 7.5-325 MG PO TABS: ORAL_TABLET | ORAL | 0 refills | Status: DC

## 2015-11-17 NOTE — Telephone Encounter (Signed)
Pt notified Rx ready for pickup. Rx printed and signed.  

## 2016-01-05 ENCOUNTER — Other Ambulatory Visit: Payer: Self-pay | Admitting: Internal Medicine

## 2016-01-05 ENCOUNTER — Telehealth: Payer: Self-pay | Admitting: Internal Medicine

## 2016-01-05 NOTE — Telephone Encounter (Signed)
Pt request refill  HYDROcodone-acetaminophen (NORCO) 7.5-325 MG tablet  Pt was supposed to see the dr, but no appointments until 12/5. Ok for pt to get a 30 day to get him through?

## 2016-01-06 ENCOUNTER — Other Ambulatory Visit: Payer: Self-pay | Admitting: Internal Medicine

## 2016-01-06 MED ORDER — HYDROCODONE-ACETAMINOPHEN 7.5-325 MG PO TABS: ORAL_TABLET | ORAL | 0 refills | Status: DC

## 2016-01-06 NOTE — Telephone Encounter (Signed)
Pt notified Rx ready for pickup. Rx printed and signed by Dr. Fry.  

## 2016-01-25 ENCOUNTER — Encounter: Payer: Self-pay | Admitting: Internal Medicine

## 2016-01-25 ENCOUNTER — Ambulatory Visit (INDEPENDENT_AMBULATORY_CARE_PROVIDER_SITE_OTHER): Payer: BLUE CROSS/BLUE SHIELD | Admitting: Internal Medicine

## 2016-01-25 DIAGNOSIS — R351 Nocturia: Secondary | ICD-10-CM

## 2016-01-25 DIAGNOSIS — N521 Erectile dysfunction due to diseases classified elsewhere: Secondary | ICD-10-CM

## 2016-01-25 DIAGNOSIS — N401 Enlarged prostate with lower urinary tract symptoms: Secondary | ICD-10-CM | POA: Diagnosis not present

## 2016-01-25 DIAGNOSIS — N529 Male erectile dysfunction, unspecified: Secondary | ICD-10-CM | POA: Insufficient documentation

## 2016-01-25 MED ORDER — DIAZEPAM 5 MG PO TABS: 5.0000 mg | ORAL_TABLET | Freq: Two times a day (BID) | ORAL | 2 refills | Status: DC | PRN

## 2016-01-25 MED ORDER — HYDROCODONE-ACETAMINOPHEN 7.5-325 MG PO TABS: ORAL_TABLET | ORAL | 0 refills | Status: DC

## 2016-01-25 MED ORDER — TAMSULOSIN HCL 0.4 MG PO CAPS: 0.4000 mg | ORAL_CAPSULE | Freq: Every day | ORAL | 3 refills | Status: DC

## 2016-01-25 NOTE — Progress Notes (Signed)
Subjective:    Patient ID: Steven Wilkerson, male    DOB: 05/31/1960, [age] y.o.   MRN: [id number]  HPI  55 year old patient who is seen today for follow-up.  He is on hydrocodone sporadically due to shoulder and low back pain.  He has been followed by orthopedics and does receive occasional shoulder injections. He has a history of BPH.  He also has a history of erectile dysfunction and has done well on Cialis for both BPH and ED.  No longer able to afford coverage with present plan  No past medical history on file.   Social History   Social History  . Marital status: Married    Spouse name: N/A  . Number of children: N/A  . Years of education: N/A   Occupational History  . Not on file.   Social History Main Topics  . Smoking status: Current Every Day Smoker    Packs/day: 1.00    Years: 30.00    Types: Cigarettes  . Smokeless tobacco: Never Used  . Alcohol use No     Comment: Sober x 10 years  . Drug use: No  . Sexual activity: Not on file   Other Topics Concern  . Not on file   Social History Narrative  . No narrative on file    No past surgical history on file.  No family history on file.  Allergies  Allergen Reactions  . Doxazosin Other (See Comments)    Leg weakness  . Sulfa Drugs Cross Reactors Rash  . Penicillins Itching    Current Outpatient Prescriptions on File Prior to Visit  Medication Sig Dispense Refill  . Multiple Vitamin (MULTIVITAMIN) tablet Take 1 tablet by mouth daily.    . tadalafil (CIALIS) 5 MG tablet One tablet by mouth daily for BPH 30 tablet 2   No current facility-administered medications on file prior to visit.     BP (!) 158/80 (BP Location: Left Arm, Patient Position: Sitting, Cuff Size: Normal)   Pulse (!) 109   Temp 98 F (36.7 C) (Oral)   Ht 5\' 7"  (1.702 m)   Wt 169 lb 12 oz (77 kg)   SpO2 97%   BMI 26.59 kg/m     Review of Systems  Constitutional: Negative for appetite change, chills, fatigue and fever.  HENT:  Negative for congestion, dental problem, ear pain, hearing loss, sore throat, tinnitus, trouble swallowing and voice change.   Eyes: Negative for pain, discharge and visual disturbance.  Respiratory: Negative for cough, chest tightness, wheezing and stridor.   Cardiovascular: Negative for chest pain, palpitations and leg swelling.  Gastrointestinal: Negative for abdominal distention, abdominal pain, blood in stool, constipation, diarrhea, nausea and vomiting.  Genitourinary: Negative for difficulty urinating, discharge, flank pain, genital sores, hematuria and urgency.  Musculoskeletal: Positive for arthralgias and back pain. Negative for gait problem, joint swelling, myalgias and neck stiffness.       Intermittent left shoulder pain  Skin: Negative for rash.  Neurological: Negative for dizziness, syncope, speech difficulty, weakness, numbness and headaches.  Hematological: Negative for adenopathy. Does not bruise/bleed easily.  Psychiatric/Behavioral: Negative for behavioral problems and dysphoric mood. The patient is not nervous/anxious.        Objective:   Physical Exam  Constitutional: He is oriented to person, place, and time. He appears well-developed.  Repeat blood pressure 134/80  HENT:  Head: Normocephalic.  Right Ear: External ear normal.  Left Ear: External ear normal.  Eyes: Conjunctivae and EOM are normal.  Neck: Normal range of motion.  Cardiovascular: Normal rate and normal heart sounds.   Pulmonary/Chest: Breath sounds normal.  Abdominal: Bowel sounds are normal.  Musculoskeletal: Normal range of motion. He exhibits no edema or tenderness.  Neurological: He is alert and oriented to person, place, and time.  Psychiatric: He has a normal mood and affect. His behavior is normal.          Assessment & Plan:   BPH.  [facility] will give a trial of generic sildenafil 20.  Description dispensed  Left shoulder and low back pain.  Analgesics refilled.   Follow-up orthopedics as needed CPX here 6 months  Lyndall Bellot Homero FellersFRANK

## 2016-01-25 NOTE — Patient Instructions (Signed)
Limit your sodium (Salt) intake  Please check your blood pressure on a regular basis.  If it is consistently greater than 150/90, please make an office appointment.    It is important that you exercise regularly, at least 20 minutes 3 to 4 times per week.  If you develop chest pain or shortness of breath seek  medical attention.  Return in 6 months for follow-up  

## 2016-01-25 NOTE — Progress Notes (Signed)
Pre visit review using our clinic review tool, if applicable. No additional management support is needed unless otherwise documented below in the visit note. 

## 2016-03-14 ENCOUNTER — Telehealth: Payer: Self-pay | Admitting: Internal Medicine

## 2016-03-14 ENCOUNTER — Other Ambulatory Visit: Payer: Self-pay | Admitting: Internal Medicine

## 2016-03-14 MED ORDER — HYDROCODONE-ACETAMINOPHEN 7.5-325 MG PO TABS: ORAL_TABLET | ORAL | 0 refills | Status: DC

## 2016-03-14 NOTE — Telephone Encounter (Signed)
Pt request refill  °HYDROcodone-acetaminophen (NORCO) 7.5-325 MG tablet °

## 2016-03-14 NOTE — Telephone Encounter (Signed)
Pt notified Rx ready for pickup. Rx printed and signed.  

## 2016-03-25 ENCOUNTER — Other Ambulatory Visit: Payer: Self-pay | Admitting: Internal Medicine

## 2016-03-27 NOTE — Telephone Encounter (Signed)
Medication refill request Medication was d/c, should pt be taking this medication? Please advise

## 2016-03-27 NOTE — Telephone Encounter (Signed)
You are correct.  doxazosin discontinued.  Patient now on Flomax

## 2016-03-30 ENCOUNTER — Other Ambulatory Visit: Payer: Self-pay

## 2016-03-30 MED ORDER — DOXAZOSIN MESYLATE 4 MG PO TABS: 4.0000 mg | ORAL_TABLET | Freq: Every day | ORAL | 5 refills | Status: DC

## 2016-04-17 ENCOUNTER — Telehealth: Payer: Self-pay | Admitting: Internal Medicine

## 2016-04-17 ENCOUNTER — Other Ambulatory Visit: Payer: Self-pay | Admitting: Internal Medicine

## 2016-04-17 MED ORDER — TADALAFIL 5 MG PO TABS: ORAL_TABLET | ORAL | 0 refills | Status: DC

## 2016-04-17 MED ORDER — HYDROCODONE-ACETAMINOPHEN 7.5-325 MG PO TABS: ORAL_TABLET | ORAL | 0 refills | Status: DC

## 2016-04-17 NOTE — Telephone Encounter (Signed)
Pt request refill  HYDROcodone-acetaminophen (NORCO) 7.5-325 MG tablet  Also request refill of tadalafil (CIALIS) 5 MG tablet [facility]   Pt states he does not like the side effects of the  tamsulosin (FLOMAX) 0.4 MG CAPS capsule  Pt is going to go back on  doxazosin (CARDURA) 4 MG tablet (pt has refills on this)

## 2016-04-17 NOTE — Telephone Encounter (Signed)
Pt notified, Rx ready for pickup. Rx printed and signed.

## 2016-05-11 ENCOUNTER — Telehealth: Payer: Self-pay | Admitting: Internal Medicine

## 2016-05-11 NOTE — Telephone Encounter (Signed)
Pt request refill  °HYDROcodone-acetaminophen (NORCO) 7.5-325 MG tablet °

## 2016-05-15 MED ORDER — HYDROCODONE-ACETAMINOPHEN 7.5-325 MG PO TABS: ORAL_TABLET | ORAL | 0 refills | Status: DC

## 2016-05-15 NOTE — Telephone Encounter (Signed)
Pt notified Rx ready for pickup. Rx printed and signed.  

## 2016-06-13 ENCOUNTER — Telehealth: Payer: Self-pay | Admitting: Internal Medicine

## 2016-06-13 MED ORDER — HYDROCODONE-ACETAMINOPHEN 7.5-325 MG PO TABS: ORAL_TABLET | ORAL | 0 refills | Status: DC

## 2016-06-13 NOTE — Telephone Encounter (Signed)
Rx printed, awaiting for signature

## 2016-06-13 NOTE — Telephone Encounter (Signed)
Pt request refill  °HYDROcodone-acetaminophen (NORCO) 7.5-325 MG tablet °

## 2016-07-11 ENCOUNTER — Telehealth: Payer: Self-pay | Admitting: Internal Medicine

## 2016-07-11 NOTE — Telephone Encounter (Signed)
Pt request refill  °HYDROcodone-acetaminophen (NORCO) 7.5-325 MG tablet °

## 2016-07-13 MED ORDER — HYDROCODONE-ACETAMINOPHEN 7.5-325 MG PO TABS: ORAL_TABLET | ORAL | 0 refills | Status: DC

## 2016-07-13 NOTE — Telephone Encounter (Signed)
Rx is printed awaiting to be signed, Dr will return to office on 07/18/16.

## 2016-07-13 NOTE — Telephone Encounter (Signed)
Pt notified Rx ready for pickup. Rx printed and signed.  

## 2016-07-26 ENCOUNTER — Other Ambulatory Visit: Payer: Self-pay | Admitting: Internal Medicine

## 2016-08-11 ENCOUNTER — Telehealth: Payer: Self-pay | Admitting: Internal Medicine

## 2016-08-11 NOTE — Telephone Encounter (Addendum)
Pt request refill  HYDROcodone-acetaminophen (NORCO) 7.5-325 MG tablet  Pt's son is coming Monday mid am and hoping to pick up pt's rx when he comes in

## 2016-08-11 NOTE — Telephone Encounter (Signed)
Last refilled on 07/13/16. Rx must last 30 days. Will refill Rx after 08/13/16.

## 2016-08-14 MED ORDER — HYDROCODONE-ACETAMINOPHEN 7.5-325 MG PO TABS: ORAL_TABLET | ORAL | 0 refills | Status: DC

## 2016-08-14 NOTE — Telephone Encounter (Signed)
Pt notified Rx ready for pickup. Rx printed and signed.  

## 2016-08-14 NOTE — Telephone Encounter (Signed)
Rx printed awaiting to be signed.  

## 2016-09-12 ENCOUNTER — Telehealth: Payer: Self-pay | Admitting: Internal Medicine

## 2016-09-12 NOTE — Telephone Encounter (Signed)
Pt request refill  °HYDROcodone-acetaminophen (NORCO) 7.5-325 MG tablet °

## 2016-09-14 NOTE — Telephone Encounter (Signed)
Please Advise

## 2016-09-14 NOTE — Telephone Encounter (Signed)
Spoke with pt explained that he needed to schedule an OV before refills, pt stated that dr Kirtland BouchardK is aware that he comes to the clinic once yearly and will be making an appointment soon.

## 2016-09-15 ENCOUNTER — Ambulatory Visit (INDEPENDENT_AMBULATORY_CARE_PROVIDER_SITE_OTHER): Payer: BLUE CROSS/BLUE SHIELD | Admitting: Internal Medicine

## 2016-09-15 ENCOUNTER — Encounter: Payer: BLUE CROSS/BLUE SHIELD | Admitting: Internal Medicine

## 2016-09-15 ENCOUNTER — Other Ambulatory Visit: Payer: Self-pay

## 2016-09-15 ENCOUNTER — Encounter: Payer: Self-pay | Admitting: Internal Medicine

## 2016-09-15 VITALS — BP 142/78 | HR 105 | Temp 98.4°F | Ht 67.0 in | Wt 167.2 lb

## 2016-09-15 DIAGNOSIS — Z0000 Encounter for general adult medical examination without abnormal findings: Secondary | ICD-10-CM

## 2016-09-15 LAB — CBC WITH DIFFERENTIAL/PLATELET
BASOS PCT: 1 %
Basophils Absolute: 67 cells/uL (ref 0–200)
EOS ABS: 67 {cells}/uL (ref 15–500)
Eosinophils Relative: 1 %
HEMATOCRIT: 40.9 % (ref 38.5–50.0)
Hemoglobin: 13.9 g/dL (ref 13.2–17.1)
LYMPHS ABS: 2077 {cells}/uL (ref 850–3900)
Lymphocytes Relative: 31 %
MCH: 32.2 pg (ref 27.0–33.0)
MCHC: 34 g/dL (ref 32.0–36.0)
MCV: 94.7 fL (ref 80.0–100.0)
MONO ABS: 469 {cells}/uL (ref 200–950)
MPV: 9.4 fL (ref 7.5–12.5)
Monocytes Relative: 7 %
NEUTROS ABS: 4020 {cells}/uL (ref 1500–7800)
Neutrophils Relative %: 60 %
Platelets: 225 10*3/uL (ref 140–400)
RBC: 4.32 MIL/uL (ref 4.20–5.80)
RDW: 12.5 % (ref 11.0–15.0)
WBC: 6.7 10*3/uL (ref 3.8–10.8)

## 2016-09-15 LAB — TSH: TSH: 0.9 m[IU]/L (ref 0.40–4.50)

## 2016-09-15 MED ORDER — DIAZEPAM 5 MG PO TABS: ORAL_TABLET | ORAL | 2 refills | Status: DC

## 2016-09-15 MED ORDER — HYDROCODONE-ACETAMINOPHEN 7.5-325 MG PO TABS: ORAL_TABLET | ORAL | 0 refills | Status: DC

## 2016-09-15 MED ORDER — DOXAZOSIN MESYLATE 4 MG PO TABS: 4.0000 mg | ORAL_TABLET | Freq: Every day | ORAL | 5 refills | Status: DC

## 2016-09-15 NOTE — Addendum Note (Signed)
Addended by: Bonnye FavaKWEI, NANA K on: 09/15/2016 04:55 PM   Modules accepted: Orders

## 2016-09-15 NOTE — Telephone Encounter (Signed)
Rx has been signed and is with Paty awaiting patients to pick up after his CPE appointment this afternoon.

## 2016-09-15 NOTE — Progress Notes (Signed)
Subjective:    Patient ID: Steven Wilkerson, male    DOB: 04/03/1960, 56 y.o.   MRN: [id number]  HPI  56 year old patient who is seen today in follow-up and for an annual exam. He has a history of chronic low back and left shoulder pain.  He is on chronic narcotic medications and his by monthly for refills.  He also uses diazepam about once daily for anxiety disorder. He has been followed by orthopedics in the past but not recently. 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  He is a one pack per day smoker  Family history.  Mother died of complications of ovarian cancer.  Father died elderly.  4 siblings in good health  No past medical history on file.   Social History   Social History  . Marital status: Married    Spouse name: N/A  . Number of children: N/A  . Years of education: N/A   Occupational History  . Not on file.   Social History Main Topics  . Smoking status: Current Every Day Smoker    Packs/day: 1.00    Years: 30.00    Types: Cigarettes  . Smokeless tobacco: Never Used  . Alcohol use No     Comment: Sober x 10 years  . Drug use: No  . Sexual activity: Not on file   Other Topics Concern  . Not on file   Social History Narrative  . No narrative on file    No past surgical history on file.  No family history on file.  Allergies  Allergen Reactions  . Doxazosin Other (See Comments)    Leg weakness  . Sulfa Drugs Cross Reactors Rash  . Penicillins Itching    Current Outpatient Prescriptions on File Prior to Visit  Medication Sig Dispense Refill  . b complex vitamins tablet Take 1 tablet by mouth daily.    . diazepam (VALIUM) 5 MG tablet TAKE ONE TABLET BY MOUTH EVERY 12 HOURS AS NEEDED FOR ANXIETY 30 tablet 2  . doxazosin (CARDURA) 4 MG tablet Take 1 tablet (4 mg total) by mouth daily. 30 tablet 5  . HYDROcodone-acetaminophen (NORCO) 7.5-325 MG tablet TAKE ONE TABLET BY MOUTH EVERY 4 HOURS AS NEEDED FOR PAIN 60 tablet  0  . Multiple Vitamin (MULTIVITAMIN) tablet Take 1 tablet by mouth daily.    . tadalafil (CIALIS) 5 MG tablet One tablet by mouth daily for BPH 30 tablet 0   No current facility-administered medications on file prior to visit.     BP (!) 142/78 (BP Location: Left Arm, Patient Position: Sitting, Cuff Size: Normal)   Pulse (!) 105   Temp 98.4 F (36.9 C) (Oral)   Ht 5\' 7"  (1.702 m)   Wt 167 lb 3.2 oz (75.8 kg)   SpO2 98%   BMI 26.19 kg/m     Review of Systems  Constitutional: Negative for activity change, appetite change, chills, fatigue and fever.  HENT: Negative for congestion, dental problem, ear pain, hearing loss, mouth sores, rhinorrhea, sinus pressure, sneezing, tinnitus, trouble swallowing and voice change.   Eyes: Negative for photophobia, pain, redness and visual disturbance.  Respiratory: Negative for apnea, cough, choking, chest tightness, shortness of breath and wheezing.   Cardiovascular: Negative for chest pain, palpitations and leg swelling.  Gastrointestinal: Negative for abdominal distention, abdominal pain, anal bleeding, blood in stool, constipation, diarrhea, nausea, rectal pain and vomiting.  Genitourinary: Negative for decreased urine volume, difficulty urinating, discharge, dysuria,  flank pain, frequency, genital sores, hematuria, penile swelling, scrotal swelling, testicular pain and urgency.  Musculoskeletal: Positive for arthralgias and back pain. Negative for gait problem, joint swelling, myalgias, neck pain and neck stiffness.  Skin: Negative for color change, rash and wound.  Neurological: Negative for dizziness, tremors, seizures, syncope, facial asymmetry, speech difficulty, weakness, light-headedness, numbness and headaches.  Hematological: Negative for adenopathy. Does not bruise/bleed easily.  Psychiatric/Behavioral: Negative for agitation, behavioral problems, confusion, decreased concentration, dysphoric mood, hallucinations, self-injury, sleep  disturbance and suicidal ideas. The patient is not nervous/anxious.        Objective:   Physical Exam  Constitutional: He appears well-developed and well-nourished.  HENT:  Head: Normocephalic and atraumatic.  Right Ear: External ear normal.  Left Ear: External ear normal.  Nose: Nose normal.  Mouth/Throat: Oropharynx is clear and moist.  Eyes: Pupils are equal, round, and reactive to light. Conjunctivae and EOM are normal. No scleral icterus.  Neck: Normal range of motion. Neck supple. No JVD present. No thyromegaly present.  Cardiovascular: Regular rhythm, normal heart sounds and intact distal pulses.  Exam reveals no gallop and no friction rub.   No murmur heard. Pulmonary/Chest: Effort normal and breath sounds normal. He exhibits no tenderness.  Abdominal: Soft. Bowel sounds are normal. He exhibits no distension and no mass. There is no tenderness.  Genitourinary: Penis normal. Rectal exam shows guaiac negative stool.  Genitourinary Comments: Prostate mildly enlarged  Musculoskeletal: Normal range of motion. He exhibits no edema or tenderness.  Lymphadenopathy:    He has no cervical adenopathy.  Neurological: He is alert. He has normal reflexes. No cranial nerve deficit. Coordination normal.  Skin: Skin is warm and dry. No rash noted.  Psychiatric: He has a normal mood and affect. His behavior is normal.          Assessment & Plan:   Preventive health examination Tobacco use.  Total smoking cessation encouraged Chronic back and left shoulder pain.  Orthopedic follow-up recommended.  Medications updated.  Check urine drug screen  Health maintenance.  Screening colonoscopy set up  Check screening lab including antibiotic for hepatitis C  Follow-up one year  Rogelia BogaKWIATKOWSKI,Letti Towell FRANK

## 2016-09-15 NOTE — Telephone Encounter (Signed)
Rx for Hydrocodone has been printed awaiting a signature from dr KKirtland Bouchard

## 2016-09-15 NOTE — Telephone Encounter (Signed)
Spoke with pt voiced understanding that he needs to schedule an appointment for a physical before any addational refills, Pt stated that he will schedule an appointment with the front desk when he picks up his Hydrocodone Rx from the office.

## 2016-09-15 NOTE — Telephone Encounter (Signed)
Pt called to check status of Rx. Pt would liek to go ahead and get the cpx done so he can get his full amount of 90 tabs instead of 60 tabs, due to his transportation issues.

## 2016-09-15 NOTE — Telephone Encounter (Signed)
Okay refill #60 Schedule annual exam Please do urine drug screen at the time of prescription pickup

## 2016-09-15 NOTE — Patient Instructions (Addendum)
Smoking tobacco is very bad for your health. You should stop smoking immediately.  Schedule your colonoscopy to help detect colon cancer.  Return in one year for follow-up  Health Maintenance, Male A healthy lifestyle and preventive care is important for your health and wellness. Ask your health care provider about what schedule of regular examinations is right for you. What should I know about weight and diet? Eat a Healthy Diet  Eat plenty of vegetables, fruits, whole grains, low-fat dairy products, and lean protein.  Do not eat a lot of foods high in solid fats, added sugars, or salt.  Maintain a Healthy Weight Regular exercise can help you achieve or maintain a healthy weight. You should:  Do at least 150 minutes of exercise each week. The exercise should increase your heart rate and make you sweat (moderate-intensity exercise).  Do strength-training exercises at least twice a week.  Watch Your Levels of Cholesterol and Blood Lipids  Have your blood tested for lipids and cholesterol every 5 years starting at 58 years of age. If you are at high risk for heart disease, you should start having your blood tested when you are 58 years old. You may need to have your cholesterol levels checked more often if: ? Your lipid or cholesterol levels are high. ? You are older than 58 years of age. ? You are at high risk for heart disease.  What should I know about cancer screening? Many types of cancers can be detected early and may often be prevented. Lung Cancer  You should be screened every year for lung cancer if: ? You are a current smoker who has smoked for at least 30 years. ? You are a former smoker who has quit within the past 15 years.  Talk to your health care provider about your screening options, when you should start screening, and how often you should be screened.  Colorectal Cancer  Routine colorectal cancer screening usually begins at 58 years of age and should be  repeated every 5-10 years until you are 58 years old. You may need to be screened more often if early forms of precancerous polyps or small growths are found. Your health care provider may recommend screening at an earlier age if you have risk factors for colon cancer.  Your health care provider may recommend using home test kits to check for hidden blood in the stool.  A small camera at the end of a tube can be used to examine your colon (sigmoidoscopy or colonoscopy). This checks for the earliest forms of colorectal cancer.  Prostate and Testicular Cancer  Depending on your age and overall health, your health care provider may do certain tests to screen for prostate and testicular cancer.  Talk to your health care provider about any symptoms or concerns you have about testicular or prostate cancer.  Skin Cancer  Check your skin from head to toe regularly.  Tell your health care provider about any new moles or changes in moles, especially if: ? There is a change in a mole's size, shape, or color. ? You have a mole that is larger than a pencil eraser.  Always use sunscreen. Apply sunscreen liberally and repeat throughout the day.  Protect yourself by wearing long sleeves, pants, a wide-brimmed hat, and sunglasses when outside.  What should I know about heart disease, diabetes, and high blood pressure?  If you are 56-34 years of age, have your blood pressure checked every 3-5 years. If you are 40 years  of age or older, have your blood pressure checked every year. You should have your blood pressure measured twice-once when you are at a hospital or clinic, and once when you are not at a hospital or clinic. Record the average of the two measurements. To check your blood pressure when you are not at a hospital or clinic, you can use: ? An automated blood pressure machine at a pharmacy. ? A home blood pressure monitor.  Talk to your health care provider about your target blood  pressure.  If you are between 4945-58 years old, ask your health care provider if you should take aspirin to prevent heart disease.  Have regular diabetes screenings by checking your fasting blood sugar level. ? If you are at a normal weight and have a low risk for diabetes, have this test once every three years after the age of 445. ? If you are overweight and have a high risk for diabetes, consider being tested at a younger age or more often.  A one-time screening for abdominal aortic aneurysm (AAA) by ultrasound is recommended for men aged 65-75 years who are current or former smokers. What should I know about preventing infection? Hepatitis B If you have a higher risk for hepatitis B, you should be screened for this virus. Talk with your health care provider to find out if you are at risk for hepatitis B infection. Hepatitis C Blood testing is recommended for:  Everyone born from [birth year] through 1965.  Anyone with known risk factors for hepatitis C.  Sexually Transmitted Diseases (STDs)  You should be screened each year for STDs including gonorrhea and chlamydia if: ? You are sexually active and are younger than 58 years of age. ? You are older than 58 years of age and your health care provider tells you that you are at risk for this type of infection. ? Your sexual activity has changed since you were last screened and you are at an increased risk for chlamydia or gonorrhea. Ask your health care provider if you are at risk.  Talk with your health care provider about whether you are at high risk of being infected with HIV. Your health care provider may recommend a prescription medicine to help prevent HIV infection.  What else can I do?  Schedule regular health, dental, and eye exams.  Stay current with your vaccines (immunizations).  Do not use any tobacco products, such as cigarettes, chewing tobacco, and e-cigarettes. If you need help quitting, ask your health care  provider.  Limit alcohol intake to no more than 2 drinks per day. One drink equals 12 ounces of beer, 5 ounces of wine, or 1 ounces of hard liquor.  Do not use street drugs.  Do not share needles.  Ask your health care provider for help if you need support or information about quitting drugs.  Tell your health care provider if you often feel depressed.  Tell your health care provider if you have ever been abused or do not feel safe at home. This information is not intended to replace advice given to you by your health care provider. Make sure you discuss any questions you have with your health care provider. Document Released: 08/05/2007 Document Revised: 10/06/2015 Document Reviewed: 11/10/2014 Elsevier Interactive Patient Education  2018 [employer]  Health Risks of Smoking Smoking cigarettes is very bad for your health. Tobacco smoke has over 200 known poisons in it. It contains the poisonous gases nitrogen oxide and carbon monoxide. There are over  60 chemicals in tobacco smoke that cause cancer. Smoking is difficult to quit because a chemical in tobacco, called nicotine, causes addiction or dependence. When you smoke and inhale, nicotine is absorbed rapidly into the bloodstream through your lungs. Both inhaled and non-inhaled nicotine may be addictive. What are the risks of cigarette smoke? Cigarette smokers have an increased risk of many serious medical problems, including:  Lung cancer.  Lung disease, such as pneumonia, bronchitis, and emphysema.  Chest pain (angina) and heart attack because the heart is not getting enough oxygen.  Heart disease and peripheral blood vessel disease.  High blood pressure (hypertension).  Stroke.  Oral cancer, including cancer of the lip, mouth, or voice box.  Bladder cancer.  Pancreatic cancer.  Cervical cancer.  Pregnancy complications, including premature birth.  Stillbirths and smaller newborn babies, birth defects, and genetic  damage to sperm.  Early menopause.  Lower estrogen level for women.  Infertility.  Facial wrinkles.  Blindness.  Increased risk of broken bones (fractures).  Senile dementia.  Stomach ulcers and internal bleeding.  Delayed wound healing and increased risk of complications during surgery.  Even smoking lightly shortens your life expectancy by several years.  Because of secondhand smoke exposure, children of smokers have an increased risk of the following:  Sudden infant death syndrome (SIDS).  Respiratory infections.  Lung cancer.  Heart disease.  Ear infections.  What are the benefits of quitting? There are many health benefits of quitting smoking. Here are some of them:  Within days of quitting smoking, your risk of having a heart attack decreases, your blood flow improves, and your lung capacity improves. Blood pressure, pulse rate, and breathing patterns start returning to normal soon after quitting.  Within months, your lungs may clear up completely.  Quitting for 10 years reduces your risk of developing lung cancer and heart disease to almost that of a nonsmoker.  People who quit may see an improvement in their overall quality of life.  How do I quit smoking? Smoking is an addiction with both physical and psychological effects, and longtime habits can be hard to change. Your health care provider can recommend:  Programs and community resources, which may include group support, education, or talk therapy.  Prescription medicines to help reduce cravings.  Nicotine replacement products, such as patches, gum, and nasal sprays. Use these products only as directed. Do not replace cigarette smoking with electronic cigarettes, which are commonly called e-cigarettes. The safety of e-cigarettes is not known, and some may contain harmful chemicals.  A combination of two or more of these methods.  Where to find more information:  American Lung Association:  www.lung.org  American Cancer Society: www.cancer.org Summary  Smoking cigarettes is very bad for your health. Cigarette smokers have an increased risk of many serious medical problems, including several cancers, heart disease, and stroke.  Smoking is an addiction with both physical and psychological effects, and longtime habits can be hard to change.  By stopping right away, you can greatly reduce the risk of medical problems for you and your family.  To help you quit smoking, your health care provider can recommend programs, community resources, prescription medicines, and nicotine replacement products such as patches, gum, and nasal sprays. This information is not intended to replace advice given to you by your health care provider. Make sure you discuss any questions you have with your health care provider. Document Released: 03/16/2004 Document Revised: 02/11/2016 Document Reviewed: 02/11/2016 Elsevier Interactive Patient Education  2017 [employer]

## 2016-09-15 NOTE — Addendum Note (Signed)
Addended by: Bonnye FavaKWEI, NANA K on: 09/15/2016 04:58 PM   Modules accepted: Orders

## 2016-09-16 LAB — HEPATIC FUNCTION PANEL
ALK PHOS: 79 U/L (ref 40–115)
ALT: 24 U/L (ref 9–46)
AST: 25 U/L (ref 10–35)
Albumin: 4.6 g/dL (ref 3.6–5.1)
BILIRUBIN DIRECT: 0 mg/dL (ref <=0.2)
BILIRUBIN INDIRECT: 0.3 mg/dL (ref 0.2–1.2)
TOTAL PROTEIN: 6.8 g/dL (ref 6.1–8.1)
Total Bilirubin: 0.3 mg/dL (ref 0.2–1.2)

## 2016-09-16 LAB — BASIC METABOLIC PANEL
BUN: 12 mg/dL (ref 7–25)
CHLORIDE: 99 mmol/L (ref 98–110)
CO2: 24 mmol/L (ref 20–31)
CREATININE: 1.15 mg/dL (ref 0.70–1.33)
Calcium: 9.6 mg/dL (ref 8.6–10.3)
GLUCOSE: 116 mg/dL — AB (ref 65–99)
POTASSIUM: 4.3 mmol/L (ref 3.5–5.3)
Sodium: 136 mmol/L (ref 135–146)

## 2016-09-16 LAB — LIPID PANEL
Cholesterol: 168 mg/dL (ref <200)
HDL: 35 mg/dL — AB (ref >40)
LDL CALC: 100 mg/dL — AB (ref <100)
TRIGLYCERIDES: 164 mg/dL — AB (ref <150)
Total CHOL/HDL Ratio: 4.8 Ratio (ref <5.0)
VLDL: 33 mg/dL — AB (ref <30)

## 2016-09-16 LAB — HEPATITIS C ANTIBODY: HCV Ab: NEGATIVE

## 2016-09-27 LAB — DRUG ABUSE PANEL 10-50, U
AMPHETAMINES (1000 ng/mL SCRN): NEGATIVE
BARBITURATES: NEGATIVE
BENZODIAZEPINES: POSITIVE — AB
COCAINE METABOLITES: NEGATIVE
HYDROCODONE: POSITIVE — AB
HYDROMORPHONE: POSITIVE — AB
MARIJUANA MET (50 ng/mL SCRN): NEGATIVE
METHADONE: NEGATIVE
METHAQUALONE: NEGATIVE
OPIATES: POSITIVE — AB
PHENCYCLIDINE: NEGATIVE
PROPOXYPHENE: NEGATIVE

## 2016-10-12 ENCOUNTER — Telehealth: Payer: Self-pay | Admitting: Internal Medicine

## 2016-10-12 NOTE — Telephone Encounter (Signed)
Pt request refill  HYDROcodone-acetaminophen (NORCO) 7.5-325 MG tablet  Pt wants to pick up Friday due to transportation issues and having to pick up other family member scripts same time. Wants to know if ok to predate for the 27 th when.

## 2016-10-13 MED ORDER — HYDROCODONE-ACETAMINOPHEN 7.5-325 MG PO TABS: ORAL_TABLET | ORAL | 0 refills | Status: DC

## 2016-10-13 NOTE — Telephone Encounter (Signed)
I spoke with pt  

## 2016-10-13 NOTE — Telephone Encounter (Signed)
Medication refill too early. Last refilled on 09/15/16.  May print out medication Rx on or after 10/16/16. RX MUST LAST 30 DAYS

## 2016-10-13 NOTE — Telephone Encounter (Signed)
Rx printed and signed.  

## 2016-10-19 ENCOUNTER — Encounter: Payer: Self-pay | Admitting: Internal Medicine

## 2016-11-09 ENCOUNTER — Encounter: Payer: Self-pay | Admitting: Internal Medicine

## 2016-11-13 ENCOUNTER — Telehealth: Payer: Self-pay | Admitting: Internal Medicine

## 2016-11-13 NOTE — Telephone Encounter (Signed)
Last seen 09/15/16 Last filled 10/13/16, #90  Please advise if okay to refill thanks.

## 2016-11-13 NOTE — Telephone Encounter (Signed)
Okay to refill? 

## 2016-11-13 NOTE — Telephone Encounter (Signed)
Pt request refill  °HYDROcodone-acetaminophen (NORCO) 7.5-325 MG tablet °

## 2016-11-14 MED ORDER — HYDROCODONE-ACETAMINOPHEN 7.5-325 MG PO TABS: ORAL_TABLET | ORAL | 0 refills | Status: DC

## 2016-11-14 NOTE — Telephone Encounter (Signed)
Pt notified Rx ready for pickup. Rx printed and signed.  

## 2016-11-14 NOTE — Telephone Encounter (Signed)
Rx printed awaiting to be signed.  

## 2016-12-13 ENCOUNTER — Telehealth: Payer: Self-pay | Admitting: Internal Medicine

## 2016-12-13 NOTE — Telephone Encounter (Signed)
Pt request refill  °HYDROcodone-acetaminophen (NORCO) 7.5-325 MG tablet °

## 2016-12-14 ENCOUNTER — Encounter: Payer: Self-pay | Admitting: Internal Medicine

## 2016-12-14 ENCOUNTER — Ambulatory Visit: Payer: BLUE CROSS/BLUE SHIELD | Admitting: Internal Medicine

## 2016-12-14 ENCOUNTER — Ambulatory Visit (INDEPENDENT_AMBULATORY_CARE_PROVIDER_SITE_OTHER): Payer: BLUE CROSS/BLUE SHIELD | Admitting: Internal Medicine

## 2016-12-14 VITALS — BP 122/70 | HR 110 | Temp 97.9°F | Ht 67.0 in | Wt 167.4 lb

## 2016-12-14 DIAGNOSIS — R52 Pain, unspecified: Secondary | ICD-10-CM

## 2016-12-14 DIAGNOSIS — G8929 Other chronic pain: Secondary | ICD-10-CM | POA: Diagnosis not present

## 2016-12-14 DIAGNOSIS — M549 Dorsalgia, unspecified: Secondary | ICD-10-CM

## 2016-12-14 DIAGNOSIS — Z79899 Other long term (current) drug therapy: Secondary | ICD-10-CM

## 2016-12-14 MED ORDER — HYDROCODONE-ACETAMINOPHEN 7.5-325 MG PO TABS: ORAL_TABLET | ORAL | 0 refills | Status: DC

## 2016-12-14 MED ORDER — TRAMADOL HCL 50 MG PO TABS: 50.0000 mg | ORAL_TABLET | Freq: Three times a day (TID) | ORAL | 0 refills | Status: DC | PRN

## 2016-12-14 NOTE — Patient Instructions (Signed)
Return as scheduled for routine follow-up 

## 2016-12-14 NOTE — Telephone Encounter (Signed)
Pt has OV today

## 2016-12-14 NOTE — Telephone Encounter (Signed)
Detailed message was left on pts voicemail to make appt.

## 2016-12-14 NOTE — Telephone Encounter (Signed)
The below letter was mailed to pt's home address.   "Dear patients, The Strengthen Opioid Misuse Prevention (STOP) Act of 2017 has been signed into law to fight the opioid problem that has had a major impact in [place] and the [place].  [facility] wants to be sure to follow the law while we continue to provide you with the exceptional care you have come to expect.   For most of you, nothing will change.  Those of you who get a regular long-term pain management prescription from one of our providers will need to schedule a separate pain management appointment.  At this appointment, your provider will talk you through the changes we have had to begin because of the STOP Act.  It's the law.  What this means for you is that now you will need to have a separate pain management visit with your provider at least every 3 months.  You will also need to sign an updated controlled substance contract that will be discussed with you in detail during your visit.   We know this change can be confusing and uncomfortable, but we are here to help you every step of the way.  Please contact us today to set up your pain management appointment.    Sincerely,   [facility] " 

## 2016-12-14 NOTE — Progress Notes (Signed)
   Subjective:    Patient ID: Steven Wilkerson, male    DOB: 10/18/1960, 56 y.o.   MRN: [id number]  HPI  56 year old patient who is seen today for pain management per opioid protocol  NCCSRS database printed, initialed  and scanned into the EMR. Controlled substance contract reviewed and signed  Indication for chronic opioid: patient has been on hydrocodone for chronic low back and shoulder pain.  He also uses Advil at bedtime to minimize narcotic use.  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  Will give a brief trial to see if this will be a adequate substitute for hydrocodone  Rogelia BogaKWIATKOWSKI,Flynn Lininger FRANK

## 2017-01-24 ENCOUNTER — Other Ambulatory Visit: Payer: Self-pay | Admitting: Internal Medicine

## 2017-01-26 ENCOUNTER — Telehealth: Payer: Self-pay | Admitting: *Deleted

## 2017-01-26 NOTE — Telephone Encounter (Signed)
Caller Name Steven Wilkerson Caller Phone Number [phone] Patient Name Steven FlattenSam Wilkerson Patient DOB 07-07-1960 Call Type Message Only Information Provided Reason for Call Request for General Office Information Initial Comment Caller wants to leave a message for Dr. Amador CunasKwiatkowski. Caller lost his December prescription script. The prescription was for Hydrocodone 7.5 325 mg. Quantity 90 count.

## 2017-01-26 NOTE — Telephone Encounter (Signed)
Copied from CRM [id number]. Topic: Quick Communication - Rx Refill/Question >> Jan 26, 2017 10:58 AM Percival SpanishKennedy, Cheryl W wrote:  Pt call to say he think he threw away his rx for Dec of the below med  HYDROcodone-acetaminophen (NORCO) 7.5-325 MG tablet

## 2017-01-31 ENCOUNTER — Other Ambulatory Visit: Payer: Self-pay | Admitting: Internal Medicine

## 2017-01-31 NOTE — Telephone Encounter (Signed)
Okay #90. 

## 2017-01-31 NOTE — Telephone Encounter (Signed)
Narcotic medications cannot be refilled without a face-to-face follow-up visit

## 2017-01-31 NOTE — Telephone Encounter (Signed)
Please see below message, please advise.

## 2017-01-31 NOTE — Telephone Encounter (Signed)
Pt notified of instructions and verbalized understanding. He would like to trial tramadol #90 again in place of hydrocodone and stay on this long-term if he finds it to be an adequate substitute.  He did briefly try tramadol after last OV, but switched back to hydrocodone. [facility]  Please advise.

## 2017-02-01 MED ORDER — TRAMADOL HCL 50 MG PO TABS: 50.0000 mg | ORAL_TABLET | Freq: Three times a day (TID) | ORAL | 0 refills | Status: DC | PRN

## 2017-02-01 NOTE — Telephone Encounter (Signed)
Rx printed, will forward to PCP for signature.

## 2017-02-02 NOTE — Telephone Encounter (Signed)
Annice PihPatricia Jaimes, LPN left message for pt yesterday notifying him rx was sent.

## 2017-02-26 ENCOUNTER — Encounter: Payer: Self-pay | Admitting: Internal Medicine

## 2017-03-01 ENCOUNTER — Other Ambulatory Visit: Payer: Self-pay | Admitting: Internal Medicine

## 2017-03-01 NOTE — Telephone Encounter (Signed)
No refills prior to follow-up visit for chronic pain management. Please schedule follow-up after March 16, 2017

## 2017-03-02 NOTE — Telephone Encounter (Addendum)
Pt wife calling, stating patient is out of meds. Its urgent he gets these meds. Patient is scheduled for 03/06/17, can not wait till then.

## 2017-03-06 ENCOUNTER — Ambulatory Visit (INDEPENDENT_AMBULATORY_CARE_PROVIDER_SITE_OTHER): Payer: BLUE CROSS/BLUE SHIELD | Admitting: Internal Medicine

## 2017-03-06 ENCOUNTER — Encounter: Payer: Self-pay | Admitting: Internal Medicine

## 2017-03-06 VITALS — BP 118/68 | HR 82 | Temp 98.3°F | Ht 67.0 in | Wt 170.0 lb

## 2017-03-06 DIAGNOSIS — G8929 Other chronic pain: Secondary | ICD-10-CM

## 2017-03-06 DIAGNOSIS — M549 Dorsalgia, unspecified: Secondary | ICD-10-CM | POA: Diagnosis not present

## 2017-03-06 MED ORDER — HYDROCODONE-ACETAMINOPHEN 7.5-325 MG PO TABS: ORAL_TABLET | ORAL | 0 refills | Status: DC

## 2017-03-06 MED ORDER — DIAZEPAM 5 MG PO TABS: ORAL_TABLET | ORAL | 2 refills | Status: DC

## 2017-03-06 NOTE — Progress Notes (Signed)
   Subjective:    Patient ID: Steven Wilkerson, male    DOB: 04/24/1960, 57 y.o.   MRN: [id number]  HPI  57 year old patient who is seen today for pain management per opioid protocol  NCCSRS database printed, initialed  and scanned into the EMR. Controlled substance contract reviewed and signed  Indication for chronic opioid: patient has been on hydrocodone for chronic low back and shoulder pain.  He also uses Advil at bedtime to minimize narcotic use.  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  He was last seen for pain management 3 months ago.  He was out of medications one week early.  This was discussed and he was told that early refills will not be prescribed in the future.  He is also requesting a refill on diazepam.  Drug drug interaction discussed and he will minimize use for occasional anxiety  # pills per month: #90 Last UDS date: 09/15/2016 Pain contract signed (Y/N): yes Date narcotic database last reviewed (include red flags):  March 06, 2017   Review of Systems     Objective:   Physical Exam        Assessment & Plan:   Encounter for chronic pain management (G89.29) Narcotic use  (711.90) Pain management contract signed (Z02.89)  Rogelia BogaKWIATKOWSKI,Eleuterio Dollar FRANK

## 2017-03-06 NOTE — Patient Instructions (Signed)
Call or return to clinic prn if these symptoms worsen or fail to improve as anticipated.

## 2017-03-07 NOTE — Telephone Encounter (Signed)
Pt had Ov on 03/06/17.

## 2017-05-30 ENCOUNTER — Other Ambulatory Visit: Payer: Self-pay | Admitting: Internal Medicine

## 2017-05-30 NOTE — Telephone Encounter (Signed)
Upcoming appointment 06/13/17; will be out of medication on 06/03/17.   [facility] [address] - [place], [place] - [address] [phone] (Phone) [phone] (Fax)

## 2017-05-30 NOTE — Telephone Encounter (Signed)
Copied from CRM [id number]. Topic: Quick Communication - Rx Refill/Question >> May 30, 2017  3:35 PM Cipriano BunkerLambe, Annette S wrote:  Medication: HYDROcodone-acetaminophen (NORCO) 7.5-325 MG tablet  He is out before his next appt. 4/24, he will be out on 4/14  Has the patient contacted their pharmacy? No. (Agent: If no, request that the patient contact the pharmacy for the refill.) Preferred Pharmacy (with phone number or street name): [facility].  [facility] [address] - [place], [place] - [address] [place] [address] [place] [address] Phone: [phone] Fax: [phone]   Agent: Please be advised that RX refills may take up to 3 business days. We ask that you follow-up with your pharmacy.

## 2017-05-31 NOTE — Telephone Encounter (Signed)
Called pharmacy and they stated that the medication was picked up on 05/08/2017. Patient informed and he said that the medication was filled 05/05/2017 and the patient stated if I can call back to verify date. I called pharmacy back and was told the medication was picked up and filled on 05/05/2017. Patient was informed and stated if I could change what I documented as far as the date. I informed him that I couldn't and that I will however right what the next pharmacist told me. Patient also stated that he hope his medication could be filled before his apt. 06/12/2017 because he will run out. He stated that he couldn't make the appointment sooner due to Dr.Kwiatkowski being on vacation.

## 2017-06-01 ENCOUNTER — Other Ambulatory Visit: Payer: Self-pay | Admitting: Internal Medicine

## 2017-06-04 NOTE — Telephone Encounter (Signed)
Pt called about the medication for his chronic pain; pt is asking for the pcp to e-script his medication so that he doesn't have to drive from [place] to get to the office, pt wants the pcp to be aware that he has an appt for the 24th this month but he is completely out of his medication and is needing some medicine to hold him over until his appt. As well he is needing a refill on his diazepam (VALIUM) 5 MG tablet [[id number]][[id number]]  That was requested on the 12th, contact pt to advise

## 2017-06-04 NOTE — Telephone Encounter (Signed)
Patient needs face-to-face return office visit for refill of any Hydrocodone  prescription

## 2017-06-04 NOTE — Telephone Encounter (Signed)
Patient wants to know if he can get his prescription before his appointment. Patient could not schedule  An earlier appointment due to Dr.Kwiatkowski being out on vacation "per patient".

## 2017-06-05 ENCOUNTER — Other Ambulatory Visit: Payer: Self-pay | Admitting: Internal Medicine

## 2017-06-05 ENCOUNTER — Other Ambulatory Visit: Payer: Self-pay

## 2017-06-05 MED ORDER — HYDROCODONE-ACETAMINOPHEN 7.5-325 MG PO TABS: ORAL_TABLET | ORAL | 0 refills | Status: DC

## 2017-06-05 MED ORDER — DIAZEPAM 5 MG PO TABS: ORAL_TABLET | ORAL | 0 refills | Status: DC

## 2017-06-05 NOTE — Telephone Encounter (Signed)
Copied from CRM [id number]. Topic: Quick Communication - Rx Refill/Question >> Jun 05, 2017  4:21 PM Percival SpanishKennedy, Cheryl W wrote: Medication    diazepam (VALIUM) 5 MG tablet              HYDROcodone-acetaminophen (NORCO) 7.5-325 MG tablet   Has the patient contacted their pharmacy yes    Preferred [facility]acy  [facility] [place]     Agent: Please be advised that RX refills may take up to 3 business days. We ask that you follow-up with your pharmacy.

## 2017-06-05 NOTE — Telephone Encounter (Signed)
Pt called to check the status, pt has been notified of the pcp's notes; pt would like to be contacted if he needs to come pick up a physical copy b/c he has to drive an hour to get to the office preferably though he would like the Rx faxed or E scripted to his pharmacy, call pt to advise

## 2017-06-06 ENCOUNTER — Other Ambulatory Visit: Payer: Self-pay

## 2017-06-06 MED ORDER — HYDROCODONE-ACETAMINOPHEN 7.5-325 MG PO TABS: ORAL_TABLET | ORAL | 0 refills | Status: DC

## 2017-06-06 NOTE — Telephone Encounter (Signed)
Rx refill request: diazepam 5 mg Hydrocodone-acetaminophen 7.5-325 mg- placed as future order- can not pend  LOV: 03/06/17  PCP: Amador CunasKwiatkowski  Pharmacy: verified

## 2017-06-12 ENCOUNTER — Ambulatory Visit: Payer: BLUE CROSS/BLUE SHIELD | Admitting: Internal Medicine

## 2017-06-13 ENCOUNTER — Encounter: Payer: Self-pay | Admitting: Internal Medicine

## 2017-06-13 ENCOUNTER — Ambulatory Visit (INDEPENDENT_AMBULATORY_CARE_PROVIDER_SITE_OTHER): Payer: BLUE CROSS/BLUE SHIELD | Admitting: Internal Medicine

## 2017-06-13 VITALS — BP 100/60 | HR 110 | Temp 98.0°F | Wt 171.8 lb

## 2017-06-13 DIAGNOSIS — F1190 Opioid use, unspecified, uncomplicated: Secondary | ICD-10-CM

## 2017-06-13 DIAGNOSIS — R52 Pain, unspecified: Secondary | ICD-10-CM

## 2017-06-13 DIAGNOSIS — M549 Dorsalgia, unspecified: Secondary | ICD-10-CM

## 2017-06-13 DIAGNOSIS — G8929 Other chronic pain: Secondary | ICD-10-CM | POA: Diagnosis not present

## 2017-06-13 MED ORDER — HYDROCODONE-ACETAMINOPHEN 7.5-325 MG PO TABS: ORAL_TABLET | ORAL | 0 refills | Status: DC

## 2017-06-13 MED ORDER — DIAZEPAM 5 MG PO TABS: ORAL_TABLET | ORAL | 0 refills | Status: DC

## 2017-06-13 NOTE — Patient Instructions (Signed)
Call or return to clinic prn if these symptoms worsen or fail to improve as anticipated.

## 2017-06-13 NOTE — Progress Notes (Signed)
   Subjective:    Patient ID: Steven Wilkerson, male    DOB: 06/14/1960, 57 y.o.   MRN: [id number]  HPI  57 year old patient who is seen today for pain management evaluation per opioid protocol.  NCCSRS database printed, initialed  and scanned into the EMR.  Indication for chronic opioid: Patient has a history of chronic low back pain as well as shoulder discomfort.  Imaging studies of his lumbar and cervical spine in the past have revealed a prior compression deformity of L2 related to prior trauma.  Mild degenerative changes were noted in the cervical spine in 2013 Medication and dose: Hydrocodone 5 maximum 90/month # pills per month: 90/month Last UDS date: September 15, 2016  Opioid Treatment Agreement signed (Y/N): Yes Opioid Treatment Agreement last reviewed with patient:  He has NCCSRS reviewed this encounter (include red flags):  He has  Review of Systems     Objective:   Physical Exam        Assessment & Plan:    Encounter for chronic pain management (G89.29) Narcotic use  (711.90) Pain management contract signed (Z02.89)  Rogelia BogaKWIATKOWSKI,Amauria Younts FRANK

## 2017-06-17 LAB — PAIN MGMT, PROFILE 8 W/CONF, U
6 ACETYLMORPHINE: NEGATIVE ng/mL (ref <10)
ALPHAHYDROXYALPRAZOLAM: NEGATIVE ng/mL (ref <25)
ALPHAHYDROXYTRIAZOLAM: NEGATIVE ng/mL (ref <50)
AMINOCLONAZEPAM: NEGATIVE ng/mL (ref <25)
AMPHETAMINES: NEGATIVE ng/mL (ref <500)
Alcohol Metabolites: NEGATIVE ng/mL (ref <500)
Alphahydroxymidazolam: NEGATIVE ng/mL (ref <50)
BENZODIAZEPINES: POSITIVE ng/mL — AB (ref <100)
Buprenorphine, Urine: NEGATIVE ng/mL (ref <5)
CODEINE: NEGATIVE ng/mL (ref <50)
Cocaine Metabolite: NEGATIVE ng/mL (ref <150)
Creatinine: 51.8 mg/dL
HYDROMORPHONE: 73 ng/mL — AB (ref <50)
Hydrocodone: 257 ng/mL — ABNORMAL HIGH (ref <50)
Hydroxyethylflurazepam: NEGATIVE ng/mL (ref <50)
Lorazepam: NEGATIVE ng/mL (ref <50)
MDMA: NEGATIVE ng/mL (ref <500)
Marijuana Metabolite: NEGATIVE ng/mL (ref <20)
Morphine: NEGATIVE ng/mL (ref <50)
Nordiazepam: 333 ng/mL — ABNORMAL HIGH (ref <50)
Norhydrocodone: 395 ng/mL — ABNORMAL HIGH (ref <50)
OXAZEPAM: 733 ng/mL — AB (ref <50)
OXIDANT: NEGATIVE ug/mL (ref <200)
OXYCODONE: NEGATIVE ng/mL (ref <100)
Opiates: POSITIVE ng/mL — AB (ref <100)
TEMAZEPAM: 564 ng/mL — AB (ref <50)
pH: 6.85 (ref 4.5–9.0)

## 2017-09-10 ENCOUNTER — Ambulatory Visit (INDEPENDENT_AMBULATORY_CARE_PROVIDER_SITE_OTHER): Payer: BLUE CROSS/BLUE SHIELD | Admitting: Internal Medicine

## 2017-09-10 ENCOUNTER — Encounter: Payer: Self-pay | Admitting: Internal Medicine

## 2017-09-10 VITALS — BP 130/62 | HR 113 | Temp 98.2°F | Wt 169.8 lb

## 2017-09-10 DIAGNOSIS — Z0000 Encounter for general adult medical examination without abnormal findings: Secondary | ICD-10-CM | POA: Diagnosis not present

## 2017-09-10 DIAGNOSIS — G8929 Other chronic pain: Secondary | ICD-10-CM

## 2017-09-10 DIAGNOSIS — M549 Dorsalgia, unspecified: Secondary | ICD-10-CM | POA: Diagnosis not present

## 2017-09-10 MED ORDER — HYDROCODONE-ACETAMINOPHEN 7.5-325 MG PO TABS: ORAL_TABLET | ORAL | 0 refills | Status: DC

## 2017-09-10 MED ORDER — DIAZEPAM 5 MG PO TABS: ORAL_TABLET | ORAL | 0 refills | Status: DC

## 2017-09-10 NOTE — Patient Instructions (Signed)
Schedule your colonoscopy to help detect colon cancer.  Return in 3 months for follow-up

## 2017-09-10 NOTE — Progress Notes (Signed)
   Subjective:    Patient ID: Steven Wilkerson, male    DOB: 06/19/1960, 57 y.o.   MRN: [id number]  HPI  57 year old patient who is seen today for pain management per opioid protocol  NCCSRS database printed, initialed  and scanned into the EMR.  Overall risk score 320  Indication for chronic opioid: Patient has a history of chronic low back pain as well as ongoing bilateral shoulder discomfort left greater than right.  Previous radiographs revealed a old healed compression fracture of L2 but otherwise only mild degenerative changes. Medication and dose: Remains on hydrocodone 5 mg maximum 90/month.  He has successfully down titrated diazepam to 1/2 tablet twice daily as needed # pills per month: 90 Last UDS date: April 2019 Opioid Treatment Agreement signed (Y/N): Yes Opioid Treatment Agreement last reviewed with patient:  June 13, 2017 NCCSRS reviewed this encounter (include red flags):  Yes   Review of Systems     Objective:   Physical Exam        Assessment & Plan:    Encounter for chronic pain management (G89.29) Narcotic use  (711.90)  Preventive health.  Screening colonoscopy discussed and patient is agreeable.  This was scheduled today  Steven Wilkerson

## 2017-09-14 ENCOUNTER — Telehealth: Payer: Self-pay | Admitting: Internal Medicine

## 2017-09-14 NOTE — Telephone Encounter (Signed)
Pt was in the office today with is son and state that he would like to transfer to Dr. Clent RidgesFry due to Dr. Kirtland BouchardK leaving is this okay.  Pt stated that he had spoke with Dr. Clent RidgesFry concerning the transfer.

## 2017-09-14 NOTE — Telephone Encounter (Signed)
Yes I agreed to see him - thanks!

## 2017-09-18 NOTE — Telephone Encounter (Signed)
Pt is aware and will call back when ready to schedule an appointment.

## 2017-09-24 ENCOUNTER — Other Ambulatory Visit: Payer: Self-pay | Admitting: Internal Medicine

## 2017-11-01 ENCOUNTER — Encounter: Payer: Self-pay | Admitting: Internal Medicine

## 2017-12-11 ENCOUNTER — Ambulatory Visit (INDEPENDENT_AMBULATORY_CARE_PROVIDER_SITE_OTHER): Payer: BLUE CROSS/BLUE SHIELD | Admitting: Family Medicine

## 2017-12-11 ENCOUNTER — Encounter: Payer: Self-pay | Admitting: Family Medicine

## 2017-12-11 VITALS — BP 118/74 | HR 106 | Temp 97.9°F | Wt 169.6 lb

## 2017-12-11 DIAGNOSIS — R351 Nocturia: Secondary | ICD-10-CM

## 2017-12-11 DIAGNOSIS — N401 Enlarged prostate with lower urinary tract symptoms: Secondary | ICD-10-CM | POA: Diagnosis not present

## 2017-12-11 DIAGNOSIS — F320 Major depressive disorder, single episode, mild: Secondary | ICD-10-CM

## 2017-12-11 DIAGNOSIS — G8929 Other chronic pain: Secondary | ICD-10-CM

## 2017-12-11 DIAGNOSIS — F1190 Opioid use, unspecified, uncomplicated: Secondary | ICD-10-CM

## 2017-12-11 DIAGNOSIS — M549 Dorsalgia, unspecified: Secondary | ICD-10-CM

## 2017-12-11 MED ORDER — HYDROCODONE-ACETAMINOPHEN 7.5-325 MG PO TABS: 1.0000 | ORAL_TABLET | Freq: Four times a day (QID) | ORAL | 0 refills | Status: DC | PRN

## 2017-12-11 MED ORDER — TAMSULOSIN HCL 0.4 MG PO CAPS: 0.4000 mg | ORAL_CAPSULE | Freq: Every day | ORAL | 3 refills | Status: DC

## 2017-12-11 MED ORDER — DIAZEPAM 5 MG PO TABS: 5.0000 mg | ORAL_TABLET | Freq: Two times a day (BID) | ORAL | 5 refills | Status: DC | PRN

## 2017-12-11 MED ORDER — BUPROPION HCL ER (XL) 150 MG PO TB24: 150.0000 mg | ORAL_TABLET | Freq: Every day | ORAL | 2 refills | Status: DC

## 2017-12-11 NOTE — Progress Notes (Signed)
   Subjective:    Patient ID: Steven Wilkerson, male    DOB: 1960-11-25, 57 y.o.   MRN: [id number]  HPI Here for pain management. He has transferred to my practice since Dr. Amador Cunas has retired. He is doing well as far as pain. He does mention some mild depression lately and asks if he can try Wellbutrin for it. Also the Doxazocin is not helping the BPH and he wants to go back to Flomax for this.  Indication for chronic opioid: low back pain  Medication and dose: Norco 7.5-325  # pills per month: 120 Last UDS date: 06-13-17 Opioid Treatment Agreement signed (Y/N): 12-11-17 Opioid Treatment Agreement last reviewed with patient:  12-11-17 NCCSRS reviewed this encounter (include red flags):  12-11-17     Review of Systems  Constitutional: Negative.   Respiratory: Negative.   Cardiovascular: Negative.   Genitourinary: Positive for difficulty urinating and frequency.  Musculoskeletal: Positive for arthralgias and back pain.  Neurological: Negative.   Psychiatric/Behavioral: Positive for dysphoric mood. The patient is not nervous/anxious.        Objective:   Physical Exam  Constitutional: He is oriented to person, place, and time. He appears well-developed and well-nourished.  Cardiovascular: Normal rate, regular rhythm, normal heart sounds and intact distal pulses.  Pulmonary/Chest: Effort normal and breath sounds normal.  Neurological: He is alert and oriented to person, place, and time.  Psychiatric: He has a normal mood and affect. His behavior is normal. Thought content normal.          Assessment & Plan:  For the back pain, Norco was refilled for 3 months. For depression he will try Wellbutrin XL 150 mg daily. For BPH he will stop Doxazocin and try Flomax 0.4 mg daily.  Gershon Crane, MD

## 2018-03-12 ENCOUNTER — Encounter: Payer: Self-pay | Admitting: Family Medicine

## 2018-03-12 ENCOUNTER — Ambulatory Visit (INDEPENDENT_AMBULATORY_CARE_PROVIDER_SITE_OTHER): Payer: BLUE CROSS/BLUE SHIELD | Admitting: Family Medicine

## 2018-03-12 VITALS — BP 122/80 | HR 103 | Temp 97.8°F | Wt 174.0 lb

## 2018-03-12 DIAGNOSIS — M549 Dorsalgia, unspecified: Secondary | ICD-10-CM

## 2018-03-12 DIAGNOSIS — G8929 Other chronic pain: Secondary | ICD-10-CM

## 2018-03-12 DIAGNOSIS — F320 Major depressive disorder, single episode, mild: Secondary | ICD-10-CM

## 2018-03-12 DIAGNOSIS — F1190 Opioid use, unspecified, uncomplicated: Secondary | ICD-10-CM | POA: Diagnosis not present

## 2018-03-12 MED ORDER — HYDROCODONE-ACETAMINOPHEN 10-325 MG PO TABS: 1.0000 | ORAL_TABLET | Freq: Four times a day (QID) | ORAL | 0 refills | Status: DC | PRN

## 2018-03-12 MED ORDER — TADALAFIL 5 MG PO TABS: 5.0000 mg | ORAL_TABLET | Freq: Every day | ORAL | 5 refills | Status: DC

## 2018-03-12 MED ORDER — AMPHETAMINE-DEXTROAMPHET ER 10 MG PO CP24: 10.0000 mg | ORAL_CAPSULE | Freq: Every day | ORAL | 0 refills | Status: DC

## 2018-03-12 NOTE — Progress Notes (Signed)
   Subjective:    Patient ID: Steven Wilkerson, male    DOB: 12-05-1960, 58 y.o.   MRN: [id number]  HPI Here for pain management. His pain levels have been porly controlled lately and he asks if the dose of his medication can be increased slightly. Also his depression has been a problem, and he knows this directly affects is pain levels. He tried Wellbutrin but had to stop due to side effects like increased anxiety and irritability. He asks to try some thing like Adderall, since his son (also my patient) is doing so well on this. Appetite and sleep are preserved.  Indication for chronic opioid: low back pain  Medication and dose: Norco 7.5-325 # pills per month: 120 Last UDS date: 06-13-17 Opioid Treatment Agreement signed (Y/N): 12-11-17 Opioid Treatment Agreement last reviewed with patient:  03-12-18 NCCSRS reviewed this encounter (include red flags):  03-12-18    Review of Systems  Constitutional: Negative.   Respiratory: Negative.   Cardiovascular: Negative.   Musculoskeletal: Positive for back pain.  Neurological: Negative.   Psychiatric/Behavioral: Positive for agitation and dysphoric mood. The patient is nervous/anxious.        Objective:   Physical Exam Constitutional:      Appearance: Normal appearance.  Cardiovascular:     Rate and Rhythm: Normal rate and regular rhythm.     Pulses: Normal pulses.     Heart sounds: Normal heart sounds.  Pulmonary:     Effort: Pulmonary effort is normal.     Breath sounds: Normal breath sounds.  Neurological:     General: No focal deficit present.     Mental Status: He is oriented to person, place, and time.  Psychiatric:        Mood and Affect: Mood normal.        Behavior: Behavior normal.        Thought Content: Thought content normal.           Assessment & Plan:  For pain management, we will increase the Norco to 10-325 as needed. For the depression he will try Adderall XR 10 mg each morning. Report back in a month.    Gershon Crane, MD

## 2018-04-10 ENCOUNTER — Telehealth: Payer: Self-pay | Admitting: Family Medicine

## 2018-04-10 NOTE — Telephone Encounter (Signed)
[facility] [address] [place] - [address] [phone] (Phone) [phone] (Fax)   Pt would like to know if he can pick his up a day early due to weather. Please advise

## 2018-04-10 NOTE — Telephone Encounter (Signed)
Medication not delegated to NT to refill  

## 2018-04-10 NOTE — Telephone Encounter (Signed)
Copied from CRM [id number]. Topic: Quick Communication - Rx Refill/Question >> Apr 10, 2018  2:28 PM Fanny Bien wrote: Medication: amphetamine-dextroamphetamine (ADDERALL XR) 10 MG 24 hr capsule [[id number]]   Has the patient contacted their pharmacy? No Preferred Pharmacy (with phone number or street name): [facility] [address] [place] - [address] [phone] (Phone) [phone] (Fax)    Agent: Please be advised that RX refills may take up to 3 business days. We ask that you follow-up with your pharmacy.

## 2018-04-11 ENCOUNTER — Telehealth: Payer: Self-pay | Admitting: Family Medicine

## 2018-04-11 MED ORDER — AMPHETAMINE-DEXTROAMPHET ER 10 MG PO CP24: 10.0000 mg | ORAL_CAPSULE | Freq: Every day | ORAL | 0 refills | Status: DC

## 2018-04-11 NOTE — Telephone Encounter (Signed)
Copied from CRM [id number]. Topic: Quick Communication - See Telephone Encounter >> Apr 11, 2018  4:43 PM Herby Abraham C wrote: CRM for notification. See Telephone encounter for: 04/11/18.  Pt is currently taking amphetamine-dextroamphetamine (ADDERALL XR) 10 MG 24 hr capsule, pt says that his pharmacy is out of stock. Pt would like to switch to Adderall 10 MG immediate release 2 times daily instead. Pt says that the XR is not helping him. Pt says that medication doesn't last long.    Pharmacy: [facility] [address], [place] - [address] [place] [address] [phone] (Phone) [phone] (Fax)

## 2018-04-11 NOTE — Telephone Encounter (Signed)
Done

## 2018-04-11 NOTE — Addendum Note (Signed)
Addended by: Gershon Crane A on: 04/11/2018 11:31 AM   Modules accepted: Orders

## 2018-04-11 NOTE — Telephone Encounter (Signed)
Patient is aware 

## 2018-04-12 MED ORDER — AMPHETAMINE-DEXTROAMPHETAMINE 10 MG PO TABS: 10.0000 mg | ORAL_TABLET | Freq: Two times a day (BID) | ORAL | 0 refills | Status: DC

## 2018-04-12 NOTE — Telephone Encounter (Signed)
Dr. Fry please advise. Thanks  

## 2018-04-12 NOTE — Telephone Encounter (Signed)
Done for one month  ?

## 2018-05-10 ENCOUNTER — Telehealth: Payer: Self-pay | Admitting: Family Medicine

## 2018-05-10 NOTE — Telephone Encounter (Deleted)
°  Relation to pt:self+ °+ °+ °+ °+ °+ °+ °+ °+ °+ °+ °+ °+ °+ °+ °+ °+ °+ °+ °+ °+ °++++++++++++++++++++++++++++++++ ° ° ° ° ° ° ° ° ° ° ° ° ° ° ° ° ° °- °Call back number: °Pharmacy: ° °Reason for call:  ° °

## 2018-05-10 NOTE — Telephone Encounter (Signed)
Copied from CRM [id number]. Topic: General - Other >> May 10, 2018 11:00 AM Leafy Ro wrote: Reason for CRM:pt left voice message and needs refill on [facility]derall. [facility] [place]

## 2018-05-13 MED ORDER — AMPHETAMINE-DEXTROAMPHETAMINE 10 MG PO TABS: 10.0000 mg | ORAL_TABLET | Freq: Two times a day (BID) | ORAL | 0 refills | Status: DC

## 2018-05-13 NOTE — Addendum Note (Signed)
Addended by: Gershon Crane A on: 05/13/2018 10:03 AM   Modules accepted: Orders

## 2018-05-13 NOTE — Telephone Encounter (Signed)
Done

## 2018-05-15 ENCOUNTER — Telehealth: Payer: Self-pay | Admitting: Family Medicine

## 2018-05-15 NOTE — Telephone Encounter (Signed)
Tell his pharmacy that on 05-13-18 I sent in 3 months of Adderall immediate release 10 mg to take bid. We need to cancel the old XR prescription. Tell him to take the pills back that they gave him so they can verify the change

## 2018-05-15 NOTE — Telephone Encounter (Signed)
Dr. Fry please advise. Thanks  

## 2018-05-15 NOTE — Telephone Encounter (Signed)
Copied from CRM [id number]. Topic: Quick Communication - See Telephone Encounter >> May 15, 2018  4:06 PM Terisa Starr wrote: CRM for notification. See Telephone encounter for: 05/15/18.'  Patient said that he picked up his adderall on 3/21 or 3/22 ( cant remember exact day ). After he got home he realized they filled the wrong adderall. He said they gave him the extended release with taking one a day. He said he needs the immediate release 2x a day. Patient would like the nurse to call him back & hopefully can get this fixed with [facility]

## 2018-05-20 NOTE — Telephone Encounter (Signed)
Called the pharmacy and they have the correct medication ready for the pt.   Pt is aware of this and the xr rx has been deactivated by the pharmacy

## 2018-05-21 ENCOUNTER — Telehealth: Payer: Self-pay | Admitting: Family Medicine

## 2018-05-21 NOTE — Telephone Encounter (Signed)
Pt would like to get back on Rx doxazosin 4 MG.  [facility]arm[facility] in [place], [place]   Pt state that he has spoke with Dr. Clent Ridges about this medication.

## 2018-05-22 MED ORDER — DOXAZOSIN MESYLATE 4 MG PO TABS: 4.0000 mg | ORAL_TABLET | Freq: Every day | ORAL | 3 refills | Status: DC

## 2018-05-22 NOTE — Telephone Encounter (Signed)
Refill has been sent to the pharmacy as per requested by the pt.  

## 2018-05-22 NOTE — Telephone Encounter (Signed)
Dr Fry please advise. thanks 

## 2018-05-22 NOTE — Telephone Encounter (Signed)
Call in Doxazocin 4 mg daily, #90 with 3 rf

## 2018-06-07 ENCOUNTER — Ambulatory Visit (INDEPENDENT_AMBULATORY_CARE_PROVIDER_SITE_OTHER): Payer: BLUE CROSS/BLUE SHIELD | Admitting: Family Medicine

## 2018-06-07 ENCOUNTER — Other Ambulatory Visit: Payer: Self-pay

## 2018-06-07 ENCOUNTER — Telehealth: Payer: Self-pay | Admitting: *Deleted

## 2018-06-07 ENCOUNTER — Encounter: Payer: Self-pay | Admitting: Family Medicine

## 2018-06-07 DIAGNOSIS — G8929 Other chronic pain: Secondary | ICD-10-CM | POA: Diagnosis not present

## 2018-06-07 DIAGNOSIS — M549 Dorsalgia, unspecified: Secondary | ICD-10-CM

## 2018-06-07 DIAGNOSIS — Z0000 Encounter for general adult medical examination without abnormal findings: Secondary | ICD-10-CM

## 2018-06-07 DIAGNOSIS — F1190 Opioid use, unspecified, uncomplicated: Secondary | ICD-10-CM

## 2018-06-07 MED ORDER — HYDROCODONE-ACETAMINOPHEN 10-325 MG PO TABS: 1.0000 | ORAL_TABLET | Freq: Four times a day (QID) | ORAL | 0 refills | Status: DC | PRN

## 2018-06-07 MED ORDER — DOXYCYCLINE HYCLATE 100 MG PO CAPS: 100.0000 mg | ORAL_CAPSULE | Freq: Two times a day (BID) | ORAL | 0 refills | Status: AC

## 2018-06-07 NOTE — Progress Notes (Signed)
Subjective:    Patient ID: Steven Wilkerson, male    DOB: 10-02-1960, 58 y.o.   MRN: [id number]  HPI Virtual Visit via Video Note  I connected with the patient on 06/10/18 at  3:00 PM EDT by a video enabled telemedicine application and verified that I am speaking with the correct person using two identifiers.  Location patient: home Location provider:work or home office Persons participating in the virtual visit: patient, provider  I discussed the limitations of evaluation and management by telemedicine and the availability of in person appointments. The patient expressed understanding and agreed to proceed.   HPI: Here for pain management. He is pleased with how the Norco helps him, but he has questions about chronic Lyme disease. He notes that in 2012 he had a tick bite and although he tested negative for Lyme at that time, his doctor treated him with Doxycycline because he had diffuse joint pains. Now in the last month he has developed some diffuse joint pains again in addition to his usual low back pain. He asks if we could prescribe some more Doxycyline for him today since it helped him feel better in the past. No recent fevers or rashes or tick bites that he knows of.    ROS: See pertinent positives and negatives per HPI.  History reviewed. No pertinent past medical history.  History reviewed. No pertinent surgical history.  History reviewed. No pertinent family history.   Current Outpatient Medications:  .  [START ON 07/13/2018] amphetamine-dextroamphetamine (ADDERALL) 10 MG tablet, Take 1 tablet (10 mg total) by mouth 2 (two) times daily for 30 days., Disp: 60 tablet, Rfl: 0 .  b complex vitamins tablet, Take 1 tablet by mouth daily., Disp: , Rfl:  .  diazepam (VALIUM) 5 MG tablet, Take 1 tablet (5 mg total) by mouth every 12 (twelve) hours as needed for anxiety., Disp: 60 tablet, Rfl: 5 .  doxazosin (CARDURA) 4 MG tablet, Take 1 tablet (4 mg total) by mouth daily., Disp: 90  tablet, Rfl: 3 .  [START ON 08/10/2018] HYDROcodone-acetaminophen (NORCO) 10-325 MG tablet, Take 1 tablet by mouth every 6 (six) hours as needed for up to 30 days for moderate pain., Disp: 120 tablet, Rfl: 0 .  Multiple Vitamin (MULTIVITAMIN) tablet, Take 1 tablet by mouth daily., Disp: , Rfl:  .  tadalafil (CIALIS) 5 MG tablet, Take 1 tablet (5 mg total) by mouth daily., Disp: 30 tablet, Rfl: 5 .  traMADol (ULTRAM) 50 MG tablet, Take 1 tablet (50 mg total) by mouth every 8 (eight) hours as needed., Disp: 90 tablet, Rfl: 0 .  doxycycline (VIBRAMYCIN) 100 MG capsule, Take 1 capsule (100 mg total) by mouth 2 (two) times daily for 30 days., Disp: 60 capsule, Rfl: 0  EXAM:  VITALS per patient if applicable:  GENERAL: alert, oriented, appears well and in no acute distress  HEENT: atraumatic, conjunttiva clear, no obvious abnormalities on inspection of external nose and ears  NECK: normal movements of the head and neck  LUNGS: on inspection no signs of respiratory distress, breathing rate appears normal, no obvious gross SOB, gasping or wheezing  CV: no obvious cyanosis  MS: moves all visible extremities without noticeable abnormality  PSYCH/NEURO: pleasant and cooperative, no obvious depression or anxiety, speech and thought processing grossly intact  ASSESSMENT AND PLAN: Pain management, his Norco was refilled. I agreed to treat him with a 30 day course of Doxycycline as well.  Gershon Crane, MD  Discussed the following assessment and  plan:  Preventative health care - Plan: Ambulatory referral to Gastroenterology     I discussed the assessment and treatment plan with the patient. The patient was provided an opportunity to ask questions and all were answered. The patient agreed with the plan and demonstrated an understanding of the instructions.   The patient was advised to call back or seek an in-person evaluation if the symptoms worsen or if the condition fails to improve as  anticipated.     Review of Systems     Objective:   Physical Exam        Assessment & Plan:

## 2018-06-07 NOTE — Telephone Encounter (Signed)
Copied from CRM [id number]. Topic: Quick Communication - See Telephone Encounter >> Jun 06, 2018  6:51 PM Aretta Nip wrote: CRM for notification. See Telephone encounter for: 06/06/18. amphetamine-dextroamphetamine (ADDERALL) 10 MG tablet  HYDROcodone-acetaminophen (NORCO) 10-325 MG tablet. Pt needs to schedule a visit for his 3 month evaluation for controlled drugs. He does not want to come into the office , covid but says that he is on the opposite side of the digital divide and gets lost on the computer. Pt states that he would feel much more at ease doing a telephone based visit. Would you consider?  His number [phone].  Please follow up with him with appt scheduling.

## 2018-06-07 NOTE — Telephone Encounter (Signed)
Called and spoke with pt and he is aware of appt today at 3pm

## 2018-06-10 ENCOUNTER — Other Ambulatory Visit: Payer: Self-pay | Admitting: Family Medicine

## 2018-07-09 ENCOUNTER — Telehealth: Payer: Self-pay | Admitting: Family Medicine

## 2018-07-09 NOTE — Telephone Encounter (Signed)
Copied from CRM [id number]. Topic: Quick Communication - Rx Refill/Question >> Jul 09, 2018  3:45 PM Jens Som A wrote: Medication: diazepam (VALIUM) 5 MG tablet [[id number]] , HYDROcodone-acetaminophen (NORCO) 10-325 MG tablet [[id number]] , amphetamine-dextroamphetamine (ADDERALL) 10 MG tablet [[id number]], doxazosin (CARDURA) 4 MG tablet [[id number]]   Has the patient contacted their pharmacy? Yes  (Agent: If no, request that the patient contact the pharmacy for the refill.) (Agent: If yes, when and what did the pharmacy advise?)  Preferred Pharmacy (with phone number or street name): [facility] [address] [place], [place] [address] [phone]  Agent: Please be advised that RX refills may take up to 3 business days. We ask that you follow-up with your pharmacy.

## 2018-07-10 ENCOUNTER — Encounter: Payer: Self-pay | Admitting: Family Medicine

## 2018-07-10 NOTE — Telephone Encounter (Signed)
Patient is calling to check on the status of this request. He will be out of medication this evening. He is changing pharmacy's. Please advise.

## 2018-07-11 NOTE — Telephone Encounter (Signed)
Patient is calling checking on status of medication refill. Patient put in medication request 2/18.  Patient has no hydrodone and diazepam.  Dr.Fry is out of the office this week. Can someone please advise  Patient call back #  [phone]

## 2018-07-11 NOTE — Telephone Encounter (Signed)
Pt called wanting to see if another provider will just assist with him getting the prescriptions transferred from [facility] to [facility] and he stated that he has called in for several days and no one told him that Dr. Clent Ridges and Marliss Czar wasn't in the office this whole week.  Pt state that he has two active scripts of Hydrocodone and five active diazepam scripts at [facility] and just need to have a new script sent to [facility] [address] in [place] [address].  Father wanted to let us know that his son is in the same situation as him and wanted to see if sons situation the same can be done for him as well.  And state no one called him back to let him know that Dr. Clent Ridges or Marliss Czar wasn't and no one else was going to do the transfer.  I made the pt aware that I will see if Asher Muir will assist with this.  Pt is aware that our hours has changed and they are from 8:00-4:00.

## 2018-07-11 NOTE — Telephone Encounter (Signed)
Pt is calling again about medications. Pt is stating he is out of medications today. Please advise.

## 2018-07-12 ENCOUNTER — Telehealth: Payer: Self-pay | Admitting: Family Medicine

## 2018-07-12 NOTE — Telephone Encounter (Unsigned)
Copied from CRM [id number]. Topic: General - Other >> Jul 12, 2018  9:12 AM Wyonia Hough E wrote: Reason for CRM: the pharmacy will no longer fill for the [facility]. Dr Clent Ridges is on vacation so the pharmacy gave Pt 5 day supply of diazepam (VALIUM) 5 MG tablet and HYDROcodone-acetaminophen (NORCO) 10-325 MG tablet to get him thru Tuesday when Dr. Clent Ridges returns and can send prescriptions to a new pharmacy.

## 2018-07-12 NOTE — Telephone Encounter (Signed)
Clinic RN spoke with patient. Per patient [facility] gave him enough medication to last until Tuesday when Dr. Clent Ridges returns to the office. Patient would like remainder quantity and ALL rx's tansfered to  [facility] on  [address]

## 2018-07-16 NOTE — Telephone Encounter (Signed)
Pt called in to check the status of his refill requests. Pt stated he is completely out and would like a call back

## 2018-07-16 NOTE — Telephone Encounter (Signed)
Patient is calling in and checking to see if medications can be sent to to the new pharmacy. Patient is out of medications at this time.

## 2018-07-17 MED ORDER — AMPHETAMINE-DEXTROAMPHETAMINE 10 MG PO TABS: 10.0000 mg | ORAL_TABLET | Freq: Two times a day (BID) | ORAL | 0 refills | Status: DC

## 2018-07-17 MED ORDER — DIAZEPAM 5 MG PO TABS: 5.0000 mg | ORAL_TABLET | Freq: Two times a day (BID) | ORAL | 0 refills | Status: DC | PRN

## 2018-07-17 MED ORDER — TADALAFIL 5 MG PO TABS: 5.0000 mg | ORAL_TABLET | Freq: Every day | ORAL | 5 refills | Status: DC

## 2018-07-17 MED ORDER — DOXAZOSIN MESYLATE 4 MG PO TABS: 4.0000 mg | ORAL_TABLET | Freq: Every day | ORAL | 3 refills | Status: DC

## 2018-07-17 MED ORDER — HYDROCODONE-ACETAMINOPHEN 10-325 MG PO TABS: 1.0000 | ORAL_TABLET | Freq: Four times a day (QID) | ORAL | 0 refills | Status: DC | PRN

## 2018-07-17 NOTE — Telephone Encounter (Signed)
Dr. Fry please advise. Thanks  

## 2018-07-17 NOTE — Telephone Encounter (Signed)
All prescriptions have been cancelled.

## 2018-07-17 NOTE — Telephone Encounter (Signed)
I sent in one month of refills to [facility]. Please cancel ALL remaining refills for all his meds at the [facility] in [place]

## 2018-07-17 NOTE — Telephone Encounter (Signed)
This was taken care of

## 2018-08-12 ENCOUNTER — Telehealth: Payer: Self-pay | Admitting: Family Medicine

## 2018-08-12 NOTE — Telephone Encounter (Signed)
Medication Refill - Medication: HYDROcodone-acetaminophen (NORCO) 10-325 MG tablet  amphetamine-dextroamphetamine (ADDERALL) 10 MG tablet   Has the patient contacted their pharmacy? Yes.   (Agent: If no, request that the patient contact the pharmacy for the refill.) (Agent: If yes, when and what did the pharmacy advise?) call pcp office   Preferred Pharmacy (with phone number or street name):   [facility] [place], [place]. [place] [phone] (Phone) [phone] (Fax)     Agent: Please be advised that RX refills may take up to 3 business days. We ask that you follow-up with your pharmacy.

## 2018-08-12 NOTE — Telephone Encounter (Signed)
He will need a PMV for this  

## 2018-08-12 NOTE — Telephone Encounter (Signed)
Dr. Fry please advise on refills. Thanks  

## 2018-08-13 ENCOUNTER — Other Ambulatory Visit: Payer: Self-pay

## 2018-08-13 ENCOUNTER — Encounter: Payer: Self-pay | Admitting: Family Medicine

## 2018-08-13 ENCOUNTER — Ambulatory Visit (INDEPENDENT_AMBULATORY_CARE_PROVIDER_SITE_OTHER): Payer: BC Managed Care – PPO | Admitting: Family Medicine

## 2018-08-13 ENCOUNTER — Other Ambulatory Visit: Payer: Self-pay | Admitting: Family Medicine

## 2018-08-13 DIAGNOSIS — F1190 Opioid use, unspecified, uncomplicated: Secondary | ICD-10-CM | POA: Diagnosis not present

## 2018-08-13 DIAGNOSIS — M549 Dorsalgia, unspecified: Secondary | ICD-10-CM | POA: Diagnosis not present

## 2018-08-13 DIAGNOSIS — G8929 Other chronic pain: Secondary | ICD-10-CM | POA: Diagnosis not present

## 2018-08-13 MED ORDER — AMPHETAMINE-DEXTROAMPHETAMINE 10 MG PO TABS: 10.0000 mg | ORAL_TABLET | Freq: Two times a day (BID) | ORAL | 0 refills | Status: DC

## 2018-08-13 MED ORDER — HYDROCODONE-ACETAMINOPHEN 10-325 MG PO TABS: 1.0000 | ORAL_TABLET | Freq: Four times a day (QID) | ORAL | 0 refills | Status: DC | PRN

## 2018-08-13 MED ORDER — DIAZEPAM 5 MG PO TABS: 5.0000 mg | ORAL_TABLET | Freq: Two times a day (BID) | ORAL | 5 refills | Status: DC | PRN

## 2018-08-13 NOTE — Progress Notes (Signed)
   Subjective:    Patient ID: Steven Wilkerson, male    DOB: 1960-08-16, 58 y.o.   MRN: [id number]  HPI Virtual Visit via Video Note  I connected with the patient on 08/13/18 at  3:15 PM EDT by a video enabled telemedicine application and verified that I am speaking with the correct person using two identifiers.  Location patient: home Location provider:work or home office Persons participating in the virtual visit: patient, provider  I discussed the limitations of evaluation and management by telemedicine and the availability of in person appointments. The patient expressed understanding and agreed to proceed.   HPI: Here for pain management. He is doing well.  Indication for chronic opioid: low back pain Medication and dose: Norco 10-325 # pills per month: 120 Last UDS date: 06-13-17 Opioid Treatment Agreement signed (Y/N): 12-18-17 Opioid Treatment Agreement last reviewed with patient:  08-13-18 NCCSRS reviewed this encounter (include red flags):   08-13-18    ROS: See pertinent positives and negatives per HPI.  History reviewed. No pertinent past medical history.  History reviewed. No pertinent surgical history.  History reviewed. No pertinent family history.   Current Outpatient Medications:  .  amphetamine-dextroamphetamine (ADDERALL) 10 MG tablet, Take 1 tablet (10 mg total) by mouth 2 (two) times daily for 30 days., Disp: 60 tablet, Rfl: 0 .  b complex vitamins tablet, Take 1 tablet by mouth daily., Disp: , Rfl:  .  diazepam (VALIUM) 5 MG tablet, Take 1 tablet (5 mg total) by mouth every 12 (twelve) hours as needed. for anxiety, Disp: 60 tablet, Rfl: 0 .  doxazosin (CARDURA) 4 MG tablet, Take 1 tablet (4 mg total) by mouth daily., Disp: 90 tablet, Rfl: 3 .  HYDROcodone-acetaminophen (NORCO) 10-325 MG tablet, Take 1 tablet by mouth every 6 (six) hours as needed for up to 30 days for moderate pain., Disp: 120 tablet, Rfl: 0 .  Multiple Vitamin (MULTIVITAMIN) tablet, Take 1  tablet by mouth daily., Disp: , Rfl:  .  tadalafil (CIALIS) 5 MG tablet, Take 1 tablet (5 mg total) by mouth daily., Disp: 30 tablet, Rfl: 5  EXAM:  VITALS per patient if applicable:  GENERAL: alert, oriented, appears well and in no acute distress  HEENT: atraumatic, conjunttiva clear, no obvious abnormalities on inspection of external nose and ears  NECK: normal movements of the head and neck  LUNGS: on inspection no signs of respiratory distress, breathing rate appears normal, no obvious gross SOB, gasping or wheezing  CV: no obvious cyanosis  MS: moves all visible extremities without noticeable abnormality  PSYCH/NEURO: pleasant and cooperative, no obvious depression or anxiety, speech and thought processing grossly intact  ASSESSMENT AND PLAN: Pain management, meds were refilled.  Alysia Penna, MD  Discussed the following assessment and plan:  No diagnosis found.     I discussed the assessment and treatment plan with the patient. The patient was provided an opportunity to ask questions and all were answered. The patient agreed with the plan and demonstrated an understanding of the instructions.   The patient was advised to call back or seek an in-person evaluation if the symptoms worsen or if the condition fails to improve as anticipated.      Review of Systems     Objective:   Physical Exam        Assessment & Plan:

## 2018-11-14 ENCOUNTER — Encounter: Payer: Self-pay | Admitting: Family Medicine

## 2018-11-14 ENCOUNTER — Telehealth (INDEPENDENT_AMBULATORY_CARE_PROVIDER_SITE_OTHER): Payer: BC Managed Care – PPO | Admitting: Family Medicine

## 2018-11-14 DIAGNOSIS — G8929 Other chronic pain: Secondary | ICD-10-CM | POA: Diagnosis not present

## 2018-11-14 DIAGNOSIS — F1190 Opioid use, unspecified, uncomplicated: Secondary | ICD-10-CM

## 2018-11-14 DIAGNOSIS — F900 Attention-deficit hyperactivity disorder, predominantly inattentive type: Secondary | ICD-10-CM | POA: Insufficient documentation

## 2018-11-14 DIAGNOSIS — M549 Dorsalgia, unspecified: Secondary | ICD-10-CM

## 2018-11-14 MED ORDER — AMPHETAMINE-DEXTROAMPHETAMINE 20 MG PO TABS: 20.0000 mg | ORAL_TABLET | Freq: Two times a day (BID) | ORAL | 0 refills | Status: DC

## 2018-11-14 MED ORDER — HYDROCODONE-ACETAMINOPHEN 10-325 MG PO TABS: 1.0000 | ORAL_TABLET | Freq: Four times a day (QID) | ORAL | 0 refills | Status: DC | PRN

## 2018-11-14 NOTE — Progress Notes (Signed)
Virtual Visit via Telephone Note  I connected with the patient on 11/14/18 at 11:30 AM EDT by telephone and verified that I am speaking with the correct person using two identifiers. We attempted to connect virtually but we had technical difficulties with the audio and video.     I discussed the limitations, risks, security and privacy concerns of performing an evaluation and management service by telephone and the availability of in person appointments. I also discussed with the patient that there may be a patient responsible charge related to this service. The patient expressed understanding and agreed to proceed.  Location patient: home Location provider: work or home office Participants present for the call: patient, provider Patient did not have a visit in the prior 7 days to address this/these issue(s).   History of Present Illness: Here for pain management, he is doing well as far as pain. He wants to increase the dose of Adderall however, since the 10 mg no longer works as well as it used to.  Indication for chronic opioid: low back pain Medication and dose: Norco 10-325 # pills per month: 120 Last UDS date: 06-13-17 Opioid Treatment Agreement signed (Y/N): 12-18-17 Opioid Treatment Agreement last reviewed with patient:  11-15-18 NCCSRS reviewed this encounter (include red flags): Yes    Observations/Objective: Patient sounds cheerful and well on the phone. I do not appreciate any SOB. Speech and thought processing are grossly intact. Patient reported vitals:  Assessment and Plan: Pain management, meds were refilled. For the ADHD, we will increase Adderall to 20 mg bid.  Alysia Penna, MD   Follow Up Instructions:     [phone] 5-10 [phone] 11-20 9443 21-30 I did not refer this patient for an OV in the next 24 hours for this/these issue(s).  I discussed the assessment and treatment plan with the patient. The patient was provided an opportunity to ask questions and all were  answered. The patient agreed with the plan and demonstrated an understanding of the instructions.   The patient was advised to call back or seek an in-person evaluation if the symptoms worsen or if the condition fails to improve as anticipated.  I provided 14 minutes of non-face-to-face time during this encounter.   Alysia Penna, MD

## 2018-12-06 ENCOUNTER — Encounter: Payer: Self-pay | Admitting: Family Medicine

## 2018-12-09 NOTE — Telephone Encounter (Signed)
Can you clarify since Dr. Sarajane Jews is out? I don't see this specified in his note. If he is asking about a dose increase, I would prefer this wait for Dr. Sarajane Jews to be back in office. It looks like the 5mg  diazepam rx should still have refills from Dr. Barbie Banner last rx?

## 2018-12-10 ENCOUNTER — Encounter: Payer: Self-pay | Admitting: Family Medicine

## 2019-01-03 ENCOUNTER — Encounter: Payer: Self-pay | Admitting: Family Medicine

## 2019-01-03 MED ORDER — DIAZEPAM 10 MG PO TABS: 10.0000 mg | ORAL_TABLET | Freq: Two times a day (BID) | ORAL | 0 refills | Status: DC | PRN

## 2019-01-03 NOTE — Telephone Encounter (Signed)
I sent in a one month supply for him to try

## 2019-01-03 NOTE — Telephone Encounter (Signed)
[place] for the Rx increase?

## 2019-01-03 NOTE — Telephone Encounter (Signed)
Okay for the update.

## 2019-01-31 ENCOUNTER — Other Ambulatory Visit: Payer: Self-pay | Admitting: Family Medicine

## 2019-01-31 ENCOUNTER — Other Ambulatory Visit: Payer: Self-pay

## 2019-01-31 ENCOUNTER — Ambulatory Visit: Payer: BC Managed Care – PPO | Admitting: Family Medicine

## 2019-02-03 ENCOUNTER — Ambulatory Visit (INDEPENDENT_AMBULATORY_CARE_PROVIDER_SITE_OTHER): Payer: BC Managed Care – PPO | Admitting: Family Medicine

## 2019-02-03 ENCOUNTER — Encounter: Payer: Self-pay | Admitting: Family Medicine

## 2019-02-03 ENCOUNTER — Other Ambulatory Visit: Payer: Self-pay

## 2019-02-03 DIAGNOSIS — F1190 Opioid use, unspecified, uncomplicated: Secondary | ICD-10-CM | POA: Diagnosis not present

## 2019-02-03 DIAGNOSIS — G8929 Other chronic pain: Secondary | ICD-10-CM | POA: Diagnosis not present

## 2019-02-03 DIAGNOSIS — M549 Dorsalgia, unspecified: Secondary | ICD-10-CM

## 2019-02-03 MED ORDER — AMPHETAMINE-DEXTROAMPHETAMINE 20 MG PO TABS: 20.0000 mg | ORAL_TABLET | Freq: Three times a day (TID) | ORAL | 0 refills | Status: DC

## 2019-02-03 MED ORDER — HYDROCODONE-ACETAMINOPHEN 10-325 MG PO TABS: 1.0000 | ORAL_TABLET | Freq: Four times a day (QID) | ORAL | 0 refills | Status: DC | PRN

## 2019-02-03 NOTE — Progress Notes (Signed)
   Subjective:    Patient ID: Steven Wilkerson, male    DOB: [birth year], 58 y.o.   MRN: [id number]  HPI Here for pain management, he is doing well.  Indication for chronic opioid: low back pain Medication and dose: Norco 10-325 # pills per month: 120 Last UDS date: 06-13-17 Opioid Treatment Agreement signed (Y/N): 12-18-17 Opioid Treatment Agreement last reviewed with patient:  02-03-19 NCCSRS reviewed this encounter (include red flags): Yes    Review of Systems     Objective:   Physical Exam        Assessment & Plan:  Pain management, meds were refilled. We will have him come into the office at his next 3 month follow up to obtain another UDS, etc.  Alysia Penna, MD

## 2019-02-03 NOTE — Telephone Encounter (Signed)
Last filled 01/03/2019 Last OV 08/13/2018  Ok to fill?

## 2019-03-18 DIAGNOSIS — Z1211 Encounter for screening for malignant neoplasm of colon: Secondary | ICD-10-CM | POA: Diagnosis not present

## 2019-04-01 ENCOUNTER — Encounter: Payer: Self-pay | Admitting: Family Medicine

## 2019-04-04 NOTE — Telephone Encounter (Signed)
We can do this, but he will need to make a PMV to discuss it and document the change

## 2019-04-09 ENCOUNTER — Telehealth: Payer: Self-pay | Admitting: *Deleted

## 2019-04-09 NOTE — Telephone Encounter (Signed)
Clinic RN called to schedule patient a PMV (pain management visit) in office appointment. Patient did not answer. LVM for patient to return call to schedule appointment. If patient returns call please schedule him an in office appointment.

## 2019-04-11 ENCOUNTER — Other Ambulatory Visit: Payer: Self-pay

## 2019-04-11 ENCOUNTER — Telehealth (INDEPENDENT_AMBULATORY_CARE_PROVIDER_SITE_OTHER): Payer: Self-pay | Admitting: Family Medicine

## 2019-04-11 DIAGNOSIS — M549 Dorsalgia, unspecified: Secondary | ICD-10-CM

## 2019-04-11 DIAGNOSIS — F1190 Opioid use, unspecified, uncomplicated: Secondary | ICD-10-CM

## 2019-04-11 DIAGNOSIS — G8929 Other chronic pain: Secondary | ICD-10-CM

## 2019-04-11 MED ORDER — HYDROCODONE-ACETAMINOPHEN 10-325 MG PO TABS: 1.0000 | ORAL_TABLET | ORAL | 0 refills | Status: DC | PRN

## 2019-04-11 MED ORDER — TADALAFIL 5 MG PO TABS: 5.0000 mg | ORAL_TABLET | Freq: Every day | ORAL | 3 refills | Status: DC

## 2019-04-11 NOTE — Progress Notes (Signed)
Virtual Visit via Telephone Note  I connected with the patient on 04/11/19 at  3:30 PM EST by telephone and verified that I am speaking with the correct person using two identifiers.   I discussed the limitations, risks, security and privacy concerns of performing an evaluation and management service by telephone and the availability of in person appointments. I also discussed with the patient that there may be a patient responsible charge related to this service. The patient expressed understanding and agreed to proceed.  Location patient: home Location provider: work or home office Participants present for the call: patient, provider Patient did not have a visit in the prior 7 days to address this/these issue(s).   History of Present Illness: Here for pain management. He asks if we can increase the frequency of dosing his Norco because it does not last a full 6 hours. Indication for chronic opioid: low back pain Medication and dose: norco 10-325 # pills per month: 180 Last UDS date: 06-13-17 Opioid Treatment Agreement signed (Y/N): 03-20-17 Opioid Treatment Agreement last reviewed with patient:  04-11-19 NCCSRS reviewed this encounter (include red flags):  04-11-19    Observations/Objective: Patient sounds cheerful and well on the phone. I do not appreciate any SOB. Speech and thought processing are grossly intact. Patient reported vitals:  Assessment and Plan: Pain management, we will change the Norco to dose every 4 hours prn. Gershon Crane, MD   Follow Up Instructions:     [phone] 5-10 [phone] 11-20 9443 21-30 I did not refer this patient for an OV in the next 24 hours for this/these issue(s).  I discussed the assessment and treatment plan with the patient. The patient was provided an opportunity to ask questions and all were answered. The patient agreed with the plan and demonstrated an understanding of the instructions.   The patient was advised to call back or seek an  in-person evaluation if the symptoms worsen or if the condition fails to improve as anticipated.  I provided 13 minutes of non-face-to-face time during this encounter.   Gershon Crane, MD

## 2019-04-30 ENCOUNTER — Telehealth: Payer: Self-pay

## 2019-04-30 NOTE — Telephone Encounter (Signed)
PA started for Cialis but noted that it is for the generic to get approved ( Tadalafil 5mg ). The medication did not populate when I tried to start a PA for Tadalafil 5mg .     Key: [id number] help? Call at [phone] Status Additional Information Required Drug Cialis 5MG  tablets Form [employer] [place] Electronic Request Form (CB)

## 2019-05-01 NOTE — Telephone Encounter (Signed)
[place] Key: [id number] help? Call us at [phone] Outcome Cancelledon March 10 not reviewable...[facility] Drug Cialis 5MG  tablets Form Blue Form (CB)     Per insurance pt no longer has pharmaceutical coverage. Called pt to advise that he could use good Rx but no answer.

## 2019-05-02 ENCOUNTER — Other Ambulatory Visit: Payer: Self-pay

## 2019-05-02 MED ORDER — TADALAFIL 5 MG PO TABS: 5.0000 mg | ORAL_TABLET | Freq: Every day | ORAL | 3 refills | Status: DC

## 2019-05-02 NOTE — Telephone Encounter (Signed)
I advised pt that his medication will be cheaper through [facility] if he wanted to try. Pt stated that this will be great and we can run it through [facility] instead of insurance.

## 2019-05-02 NOTE — Telephone Encounter (Signed)
Rx signed pt advised of update. Pt stated he will come by office today. Rx placed in front office filing cabinet.

## 2019-05-02 NOTE — Telephone Encounter (Signed)
Spoke to pt and he stated he does still have covers for medications. Pt stated that the medication is only for B[facility] and not for ED. I advised pt that I can try to run it again but advised pt to reach out to insurance issue. Pt verbalized understanding.

## 2019-05-02 NOTE — Telephone Encounter (Signed)
Rx printed off for Dr.Fry to sign.

## 2019-05-02 NOTE — Telephone Encounter (Signed)
Spoke to Dr.Fry verbally and he stated it was fine to print off Rx for pt to sign. No further action needed.

## 2019-05-02 NOTE — Telephone Encounter (Signed)
Call pt insurance company was on hold for 10 min. So I disconnected the call. Called pharmacy to see if she knew the reason for PA being started. Pharmacist stated that it generally shows why but it did not show her for this pt. [employer] company again and they stated that pt no longer has Rx coverage under his policy. Tried to call pt to update but no answer.

## 2019-05-05 NOTE — Telephone Encounter (Signed)
Pt picked up Rx. No further action needed!

## 2019-05-06 ENCOUNTER — Telehealth: Payer: Self-pay | Admitting: Family Medicine

## 2019-05-06 NOTE — Telephone Encounter (Signed)
Pt call and stated his amphetamine-dextroamphetamine (ADDERALL) 20 MG tablet was not call in to [id number] - [place], [place] - [address] Phone:  [phone]  Fax:  [phone]     Pt want to thank you for a your help.

## 2019-05-07 MED ORDER — AMPHETAMINE-DEXTROAMPHETAMINE 20 MG PO TABS: 20.0000 mg | ORAL_TABLET | Freq: Three times a day (TID) | ORAL | 0 refills | Status: DC

## 2019-05-07 NOTE — Telephone Encounter (Signed)
Done

## 2019-07-08 ENCOUNTER — Telehealth: Payer: Self-pay | Admitting: Family Medicine

## 2019-07-08 ENCOUNTER — Encounter: Payer: Self-pay | Admitting: Family Medicine

## 2019-07-08 ENCOUNTER — Telehealth (INDEPENDENT_AMBULATORY_CARE_PROVIDER_SITE_OTHER): Payer: Self-pay | Admitting: Family Medicine

## 2019-07-08 ENCOUNTER — Ambulatory Visit: Payer: Self-pay | Admitting: Family Medicine

## 2019-07-08 ENCOUNTER — Other Ambulatory Visit: Payer: Self-pay

## 2019-07-08 DIAGNOSIS — F1190 Opioid use, unspecified, uncomplicated: Secondary | ICD-10-CM

## 2019-07-08 DIAGNOSIS — M549 Dorsalgia, unspecified: Secondary | ICD-10-CM

## 2019-07-08 DIAGNOSIS — G8929 Other chronic pain: Secondary | ICD-10-CM

## 2019-07-08 MED ORDER — HYDROCODONE-ACETAMINOPHEN 10-325 MG PO TABS: 1.0000 | ORAL_TABLET | ORAL | 0 refills | Status: DC | PRN

## 2019-07-08 MED ORDER — AMPHETAMINE-DEXTROAMPHETAMINE 30 MG PO TABS
30.0000 mg | ORAL_TABLET | Freq: Three times a day (TID) | ORAL | 0 refills | Status: DC
Start: 2019-09-07 — End: 2019-10-06

## 2019-07-08 MED ORDER — AMPHETAMINE-DEXTROAMPHETAMINE 30 MG PO TABS: 30.0000 mg | ORAL_TABLET | Freq: Three times a day (TID) | ORAL | 0 refills | Status: DC

## 2019-07-08 MED ORDER — AMPHETAMINE-DEXTROAMPHETAMINE 30 MG PO TABS
30.0000 mg | ORAL_TABLET | Freq: Three times a day (TID) | ORAL | 0 refills | Status: DC
Start: 2019-08-08 — End: 2019-07-08

## 2019-07-08 MED ORDER — BUSPIRONE HCL 5 MG PO TABS: 5.0000 mg | ORAL_TABLET | Freq: Three times a day (TID) | ORAL | 5 refills | Status: DC

## 2019-07-08 NOTE — Progress Notes (Signed)
   Subjective:    Patient ID: Steven Wilkerson, male    DOB: February 01, 1961, 59 y.o.   MRN: [id number]  HPI Virtual Visit via Telephone Note  I connected with the patient on 07/08/19 at  1:45 PM EDT by telephone and verified that I am speaking with the correct person using two identifiers.   I discussed the limitations, risks, security and privacy concerns of performing an evaluation and management service by telephone and the availability of in person appointments. I also discussed with the patient that there may be a patient responsible charge related to this service. The patient expressed understanding and agreed to proceed.  Location patient: home Location provider: work or home office Participants present for the call: patient, provider Patient did not have a visit in the prior 7 days to address this/these issue(s).   History of Present Illness: Here for pain management. His back pain has been stable. As far as ADHD, he asks to increase the Adderall to 30 mg TID. He also asks to try Buspar for his anxiety. Other family members take this and they are doing very well.  Indication for chronic opioid: low back pain Medication and dose: Norco 10-325 # pills per month: 180 Last UDS date: 06-13-17 Opioid Treatment Agreement signed (Y/N): 03-20-17 Opioid Treatment Agreement last reviewed with patient:  07-08-19 NCCSRS reviewed this encounter (include red flags):  07-08-19    Observations/Objective: Patient sounds cheerful and well on the phone. I do not appreciate any SOB. Speech and thought processing are grossly intact. Patient reported vitals:  Assessment and Plan: Pain management, Norco is refilled. For anxiety, try Buspar 5 mg TID. For ADHD increase Adderall to 30 mg TID.  Gershon Crane, MD   Follow Up Instructions:     [phone] 5-10 [phone] 11-20 9443 21-30 I did not refer this patient for an OV in the next 24 hours for this/these issue(s).  I discussed the assessment and treatment  plan with the patient. The patient was provided an opportunity to ask questions and all were answered. The patient agreed with the plan and demonstrated an understanding of the instructions.   The patient was advised to call back or seek an in-person evaluation if the symptoms worsen or if the condition fails to improve as anticipated.  I provided 14 minutes of non-face-to-face time during this encounter.   Gershon Crane, MD    Review of Systems     Objective:   Physical Exam        Assessment & Plan:

## 2019-07-08 NOTE — Progress Notes (Signed)
   Subjective:    Patient ID: Steven Wilkerson, male    DOB: 1960/05/12, 59 y.o.   MRN: [id number]  HPI Virtual Visit via Telephone Note  I connected with the patient on 07/08/19 at 11:15 AM EDT by telephone and verified that I am speaking with the correct person using two identifiers.   I discussed the limitations, risks, security and privacy concerns of performing an evaluation and management service by telephone and the availability of in person appointments. I also discussed with the patient that there may be a patient responsible charge related to this service. The patient expressed understanding and agreed to proceed.  Location patient: home Location provider: work or home office Participants present for the call: patient, provider Patient did not have a visit in the prior 7 days to address this/these issue(s).   History of Present Illness:    Observations/Objective: Patient sounds cheerful and well on the phone. I do not appreciate any SOB. Speech and thought processing are grossly intact. Patient reported vitals:  Assessment and Plan:   Follow Up Instructions:     [id number] 5-10 [id number] 11-20 9443 21-30 I did not refer this patient for an OV in the next 24 hours for this/these issue(s).  I discussed the assessment and treatment plan with the patient. The patient was provided an opportunity to ask questions and all were answered. The patient agreed with the plan and demonstrated an understanding of the instructions.   The patient was advised to call back or seek an in-person evaluation if the symptoms worsen or if the condition fails to improve as anticipated.  I provided 14 minutes of non-face-to-face time during this encounter.   Gershon Crane, MD    Review of Systems     Objective:   Physical Exam        Assessment & Plan:

## 2019-07-09 ENCOUNTER — Telehealth: Payer: Self-pay | Admitting: Family Medicine

## 2019-07-25 ENCOUNTER — Other Ambulatory Visit: Payer: Self-pay | Admitting: Family Medicine

## 2019-07-28 NOTE — Telephone Encounter (Signed)
Last filled 02/03/2019 Last OV 07/08/2019  Ok to fill?

## 2019-08-14 ENCOUNTER — Telehealth: Payer: Self-pay | Admitting: Family Medicine

## 2019-08-14 NOTE — Telephone Encounter (Signed)
Annette with [facility], is requesting a new prescription for a 90 day supply of Buspirone 5 mg. Thanks   [phone] [phone]

## 2019-08-15 NOTE — Telephone Encounter (Signed)
ATC the patient. This prescription was sent in to [facility] on 07/08/2019. Called to confirm that this needs [facility]. No answer, unable to leave a voice mail.

## 2019-08-15 NOTE — Addendum Note (Signed)
Addended by: Solon Augusta on: 08/15/2019 08:31 AM   Modules accepted: Orders

## 2019-09-24 ENCOUNTER — Telehealth: Payer: Self-pay | Admitting: Family Medicine

## 2019-10-06 ENCOUNTER — Other Ambulatory Visit: Payer: Self-pay

## 2019-10-06 ENCOUNTER — Encounter: Payer: Self-pay | Admitting: Family Medicine

## 2019-10-06 ENCOUNTER — Telehealth (INDEPENDENT_AMBULATORY_CARE_PROVIDER_SITE_OTHER): Payer: Self-pay | Admitting: Family Medicine

## 2019-10-06 DIAGNOSIS — M549 Dorsalgia, unspecified: Secondary | ICD-10-CM

## 2019-10-06 DIAGNOSIS — F1190 Opioid use, unspecified, uncomplicated: Secondary | ICD-10-CM

## 2019-10-06 DIAGNOSIS — G8929 Other chronic pain: Secondary | ICD-10-CM

## 2019-10-06 MED ORDER — HYDROCODONE-ACETAMINOPHEN 10-325 MG PO TABS: 1.0000 | ORAL_TABLET | ORAL | 0 refills | Status: DC | PRN

## 2019-10-06 MED ORDER — AMPHETAMINE-DEXTROAMPHETAMINE 30 MG PO TABS
30.0000 mg | ORAL_TABLET | Freq: Three times a day (TID) | ORAL | 0 refills | Status: DC
Start: 2019-10-06 — End: 2019-10-06

## 2019-10-06 MED ORDER — AMPHETAMINE-DEXTROAMPHETAMINE 30 MG PO TABS
30.0000 mg | ORAL_TABLET | Freq: Three times a day (TID) | ORAL | 0 refills | Status: DC
Start: 2019-11-06 — End: 2019-10-06

## 2019-10-06 MED ORDER — AMPHETAMINE-DEXTROAMPHETAMINE 30 MG PO TABS
30.0000 mg | ORAL_TABLET | Freq: Three times a day (TID) | ORAL | 0 refills | Status: DC
Start: 2019-12-06 — End: 2020-01-06

## 2019-10-06 NOTE — Telephone Encounter (Signed)
This is the number we have in the chart and the patient has already been seen. Nothing further needed.

## 2019-10-07 ENCOUNTER — Encounter: Payer: Self-pay | Admitting: Family Medicine

## 2019-10-07 NOTE — Progress Notes (Signed)
   Subjective:    Patient ID: Steven Wilkerson, male    DOB: 1960/11/05, 59 y.o.   MRN: [id number]  HPI Virtual Visit via Telephone Note  I connected with the patient on 10/07/19 at  3:45 PM EDT by telephone and verified that I am speaking with the correct person using two identifiers.   I discussed the limitations, risks, security and privacy concerns of performing an evaluation and management service by telephone and the availability of in person appointments. I also discussed with the patient that there may be a patient responsible charge related to this service. The patient expressed understanding and agreed to proceed.  Location patient: home Location provider: work or home office Participants present for the call: patient, provider Patient did not have a visit in the prior 7 days to address this/these issue(s).   History of Present Illness: Here for pain management, he is still struggling with back pain. His job is quite physical. Indication for chronic opioid: low back pain Medication and dose: Norco 10-325 # pills per month: 180 Last UDS date: 06-13-17 Opioid Treatment Agreement signed (Y/N): 03-20-17 Opioid Treatment Agreement last reviewed with patient:  10-06-19 NCCSRS reviewed this encounter (include red flags): Yes    Observations/Objective: Patient sounds cheerful and well on the phone. I do not appreciate any SOB. Speech and thought processing are grossly intact. Patient reported vitals:  Assessment and Plan: Pain management, meds were refilled.  Gershon Crane, MD   Follow Up Instructions:     [phone] 5-10 [phone] 11-20 9443 21-30 I did not refer this patient for an OV in the next 24 hours for this/these issue(s).  I discussed the assessment and treatment plan with the patient. The patient was provided an opportunity to ask questions and all were answered. The patient agreed with the plan and demonstrated an understanding of the instructions.   The patient was  advised to call back or seek an in-person evaluation if the symptoms worsen or if the condition fails to improve as anticipated.  I provided 11 minutes of non-face-to-face time during this encounter.   Gershon Crane, MD    Review of Systems     Objective:   Physical Exam        Assessment & Plan:

## 2019-12-26 ENCOUNTER — Other Ambulatory Visit: Payer: Self-pay | Admitting: Family Medicine

## 2020-01-02 ENCOUNTER — Encounter: Payer: Self-pay | Admitting: Family Medicine

## 2020-01-02 ENCOUNTER — Telehealth (INDEPENDENT_AMBULATORY_CARE_PROVIDER_SITE_OTHER): Payer: Self-pay | Admitting: Family Medicine

## 2020-01-02 DIAGNOSIS — G8929 Other chronic pain: Secondary | ICD-10-CM

## 2020-01-02 DIAGNOSIS — M549 Dorsalgia, unspecified: Secondary | ICD-10-CM

## 2020-01-02 DIAGNOSIS — F1190 Opioid use, unspecified, uncomplicated: Secondary | ICD-10-CM

## 2020-01-02 MED ORDER — HYDROCODONE-ACETAMINOPHEN 10-325 MG PO TABS: 1.0000 | ORAL_TABLET | ORAL | 0 refills | Status: DC | PRN

## 2020-01-02 NOTE — Progress Notes (Signed)
   Subjective:    Patient ID: Steven Wilkerson, male    DOB: [birth year], 59 y.o.   MRN: [id number]  HPI Here for pain management. He is doing well.  Indication for chronic opioid: low back pain Medication and dose: Norco 10-325 # pills per month: 180 Last UDS date: 06-13-17 Opioid Treatment Agreement signed (Y/N): 03-20-17 Opioid Treatment Agreement last reviewed with patient:  01-02-20 NCCSRS reviewed this encounter (include red flags): Yes Virtual Visit via Telephone Note  I connected with the patient on 01/02/20 at 11:30 AM EST by telephone and verified that I am speaking with the correct person using two identifiers.   I discussed the limitations, risks, security and privacy concerns of performing an evaluation and management service by telephone and the availability of in person appointments. I also discussed with the patient that there may be a patient responsible charge related to this service. The patient expressed understanding and agreed to proceed.  Location patient: home Location provider: work or home office Participants present for the call: patient, provider Patient did not have a visit in the prior 7 days to address this/these issue(s).   History of Present Illness:    Observations/Objective: Patient sounds cheerful and well on the phone. I do not appreciate any SOB. Speech and thought processing are grossly intact. Patient reported vitals:  Assessment and Plan:   Follow Up Instructions:     [id number] 5-10 [id number] 11-20 9443 21-30 I did not refer this patient for an OV in the next 24 hours for this/these issue(s).  I discussed the assessment and treatment plan with the patient. The patient was provided an opportunity to ask questions and all were answered. The patient agreed with the plan and demonstrated an understanding of the instructions.   The patient was advised to call back or seek an in-person evaluation if the symptoms worsen or if the condition fails to  improve as anticipated.  I provided 11 minutes of non-face-to-face time during this encounter.   Gershon Crane, MD    Review of Systems     Objective:   Physical Exam        Assessment & Plan:  Pain management, meds were refilled for one month only. I told him he will need to supply a urine sample for a drug screen before ANY refills will be given after that.  Gershon Crane, MD

## 2020-01-05 ENCOUNTER — Telehealth: Payer: Self-pay | Admitting: Family Medicine

## 2020-01-05 NOTE — Telephone Encounter (Signed)
Pt is calling in needing a refill on Rx amphetamine-dextroamphetamine (ADDERALL) 30 MG  Pharm:  [facility] on [address] in [place], [place].  Pt would like to see if he could get it today so that he only has to make one trip to the pharmacy.

## 2020-01-05 NOTE — Telephone Encounter (Signed)
Pt requesting a refill on:  Adderall 30 mg LR 12/06/19, #90, 0 rf LOV 01/02/20 FOV  None scheduled.    Please review and advise.  Thanks.   Dm/cma

## 2020-01-06 ENCOUNTER — Other Ambulatory Visit: Payer: Self-pay | Admitting: Family Medicine

## 2020-01-06 MED ORDER — AMPHETAMINE-DEXTROAMPHETAMINE 30 MG PO TABS
30.0000 mg | ORAL_TABLET | Freq: Three times a day (TID) | ORAL | 0 refills | Status: DC
Start: 2020-01-06 — End: 2020-01-06

## 2020-01-06 MED ORDER — AMPHETAMINE-DEXTROAMPHETAMINE 30 MG PO TABS
30.0000 mg | ORAL_TABLET | Freq: Three times a day (TID) | ORAL | 0 refills | Status: DC
Start: 2020-02-05 — End: 2020-01-06

## 2020-01-06 MED ORDER — AMPHETAMINE-DEXTROAMPHETAMINE 30 MG PO TABS
30.0000 mg | ORAL_TABLET | Freq: Three times a day (TID) | ORAL | 0 refills | Status: DC
Start: 2020-03-07 — End: 2020-03-31

## 2020-01-06 NOTE — Telephone Encounter (Signed)
Done

## 2020-01-06 NOTE — Telephone Encounter (Signed)
Patient notified VIA phone that RX was sent to the pharmacy.  No questions.  Steven Wilkerson

## 2020-02-03 ENCOUNTER — Encounter: Payer: Self-pay | Admitting: Family Medicine

## 2020-02-03 ENCOUNTER — Telehealth: Payer: Self-pay | Admitting: Family Medicine

## 2020-02-04 MED ORDER — HYDROCODONE-ACETAMINOPHEN 10-325 MG PO TABS: 1.0000 | ORAL_TABLET | ORAL | 0 refills | Status: AC | PRN

## 2020-02-04 NOTE — Telephone Encounter (Signed)
I sent in a 5 day supply of pain medication. He already has enough refills for Adderall to last until February

## 2020-02-11 ENCOUNTER — Ambulatory Visit (INDEPENDENT_AMBULATORY_CARE_PROVIDER_SITE_OTHER): Payer: BC Managed Care – PPO

## 2020-02-11 ENCOUNTER — Other Ambulatory Visit (INDEPENDENT_AMBULATORY_CARE_PROVIDER_SITE_OTHER): Payer: Self-pay

## 2020-02-11 ENCOUNTER — Encounter: Payer: Self-pay | Admitting: Family Medicine

## 2020-02-11 ENCOUNTER — Ambulatory Visit (INDEPENDENT_AMBULATORY_CARE_PROVIDER_SITE_OTHER): Payer: Self-pay | Admitting: Family Medicine

## 2020-02-11 ENCOUNTER — Other Ambulatory Visit: Payer: Self-pay

## 2020-02-11 VITALS — BP 140/78 | HR 98 | Temp 97.7°F | Ht 67.01 in | Wt 167.2 lb

## 2020-02-11 DIAGNOSIS — M5442 Lumbago with sciatica, left side: Secondary | ICD-10-CM

## 2020-02-11 DIAGNOSIS — G8929 Other chronic pain: Secondary | ICD-10-CM

## 2020-02-11 DIAGNOSIS — F1190 Opioid use, unspecified, uncomplicated: Secondary | ICD-10-CM

## 2020-02-11 MED ORDER — HYDROCODONE-ACETAMINOPHEN 10-325 MG PO TABS: 1.0000 | ORAL_TABLET | Freq: Four times a day (QID) | ORAL | 0 refills | Status: DC | PRN

## 2020-02-11 NOTE — Telephone Encounter (Signed)
Patient stated that provider sent in his refill for Hydrocodone- acetaminophen and only 120 was sent to the pharmacy and it was supposed to be for 180, please advise. CB is [phone]

## 2020-02-11 NOTE — Telephone Encounter (Signed)
HYDROcodone-acetaminophen (NORCO) 10-325 MG tablet  The Rx is supposed to be for 180 tablets for 3 scripts  [id number] - [place], [place] - [address] AT [address] Phone:  [phone]  Fax:  [phone]

## 2020-02-11 NOTE — Progress Notes (Signed)
   Subjective:    Patient ID: Steven Wilkerson, male    DOB: [birth year], 59 y.o.   MRN: [id number]  HPI Here for pain management. His low back pain has been getting worse. He often has pains radiating down the left leg now.  Indication for chronic opioid: low back pain Medication and dose: Norco 10-325 # pills per month: 120 Last UDS date: 02-11-20 Opioid Treatment Agreement signed (Y/N): 03-20-17 Opioid Treatment Agreement last reviewed with patient:  02-11-20 NCCSRS reviewed this encounter (include red flags): Yes     Review of Systems     Objective:   Physical Exam        Assessment & Plan:  Pain management, meds were refilled. We will get Xrays of his lumbar spine today.  Gershon Crane, MD

## 2020-02-12 NOTE — Telephone Encounter (Signed)
Please advise about RX 

## 2020-02-13 LAB — DRUG MONITOR, PANEL 1, W/CONF, URINE
Alphahydroxyalprazolam: NEGATIVE ng/mL (ref <25)
Alphahydroxymidazolam: NEGATIVE ng/mL (ref <50)
Alphahydroxytriazolam: NEGATIVE ng/mL (ref <50)
Aminoclonazepam: NEGATIVE ng/mL (ref <25)
Amphetamine: >15000 ng/mL — ABNORMAL HIGH (ref <250)
Amphetamines: POSITIVE ng/mL — AB (ref <500)
Barbiturates: NEGATIVE ng/mL (ref <300)
Benzodiazepines: POSITIVE ng/mL — AB (ref <100)
Cocaine Metabolite: NEGATIVE ng/mL (ref <150)
Codeine: NEGATIVE ng/mL (ref <50)
Creatinine: 191.6 mg/dL
Hydrocodone: 4296 ng/mL — ABNORMAL HIGH (ref <50)
Hydromorphone: 1127 ng/mL — ABNORMAL HIGH (ref <50)
Hydroxyethylflurazepam: NEGATIVE ng/mL (ref <50)
Lorazepam: NEGATIVE ng/mL (ref <50)
Marijuana Metabolite: 255 ng/mL — ABNORMAL HIGH (ref <5)
Marijuana Metabolite: POSITIVE ng/mL — AB (ref <20)
Methadone Metabolite: NEGATIVE ng/mL (ref <100)
Methamphetamine: NEGATIVE ng/mL (ref <250)
Morphine: NEGATIVE ng/mL (ref <50)
Nordiazepam: 2378 ng/mL — ABNORMAL HIGH (ref <50)
Norhydrocodone: >10000 ng/mL — ABNORMAL HIGH (ref <50)
Opiates: POSITIVE ng/mL — AB (ref <100)
Oxazepam: >8000 ng/mL — ABNORMAL HIGH (ref <50)
Oxidant: NEGATIVE ug/mL
Oxycodone: NEGATIVE ng/mL (ref <100)
Phencyclidine: NEGATIVE ng/mL (ref <25)
Temazepam: 6697 ng/mL — ABNORMAL HIGH (ref <50)
pH: 6.3 (ref 4.5–9.0)

## 2020-02-13 LAB — DM TEMPLATE

## 2020-02-14 ENCOUNTER — Encounter: Payer: Self-pay | Admitting: Family Medicine

## 2020-02-16 ENCOUNTER — Telehealth: Payer: Self-pay | Admitting: Family Medicine

## 2020-02-16 ENCOUNTER — Other Ambulatory Visit: Payer: Self-pay

## 2020-02-16 MED ORDER — HYDROCODONE-ACETAMINOPHEN 10-325 MG PO TABS: 1.0000 | ORAL_TABLET | ORAL | 0 refills | Status: DC | PRN

## 2020-02-16 MED ORDER — METHOCARBAMOL 500 MG PO TABS: 500.0000 mg | ORAL_TABLET | Freq: Four times a day (QID) | ORAL | 5 refills | Status: DC | PRN

## 2020-02-16 NOTE — Telephone Encounter (Signed)
Pt been notified. 

## 2020-02-16 NOTE — Addendum Note (Signed)
Addended by: Gershon Crane A on: 02/16/2020 12:29 PM   Modules accepted: Orders

## 2020-02-16 NOTE — Telephone Encounter (Signed)
I sent in for #60 of Norco to go with the 120 he already picked up to cover this month. I also sent in a muscle relaxer (Robaxin) to try

## 2020-02-16 NOTE — Telephone Encounter (Signed)
I sent in for 3 months of #180. Please call the pharmacy to cancel the ones for #120

## 2020-02-16 NOTE — Telephone Encounter (Signed)
Patient's prescription was called in wrong to the pharmacy.  Patient sent a MyChart message to get it corrected but it is still wrong.  He got the prescription for 120 because he was out, but it was supposed to be 180. Dr. Clent Ridges called in 3 more months of 180, however the patient needs a 60 count called in to go along with the 120 he picked up to make up a total of 180.  He needs this to say he can pick it up immediately to go along with the 120 he has already picked up.  The pharmacy is stating to make a change, Dr Claris Che office will need to call and talk to them.    [facility] - [address], [place], [place]   Also, Pt received a call from Tobi Bastos to give him his results of his xray so he is returning her call.

## 2020-03-17 ENCOUNTER — Other Ambulatory Visit: Payer: Self-pay | Admitting: Family Medicine

## 2020-03-30 ENCOUNTER — Encounter: Payer: Self-pay | Admitting: Family Medicine

## 2020-03-30 ENCOUNTER — Other Ambulatory Visit: Payer: Self-pay | Admitting: Family Medicine

## 2020-03-31 MED ORDER — AMPHETAMINE-DEXTROAMPHETAMINE 30 MG PO TABS: 30.0000 mg | ORAL_TABLET | Freq: Three times a day (TID) | ORAL | 0 refills | Status: DC

## 2020-03-31 NOTE — Telephone Encounter (Signed)
Done

## 2020-04-05 NOTE — Telephone Encounter (Signed)
lft VM that RX was already sent to the pharmacy and has refills till May per provider. Dm/cma

## 2020-04-05 NOTE — Telephone Encounter (Signed)
He already has refills until May 9

## 2020-05-07 ENCOUNTER — Telehealth: Payer: Self-pay | Admitting: Family Medicine

## 2020-05-07 NOTE — Telephone Encounter (Signed)
Patient has new Rx Insurance  The new insurance requires for one Rx to be sent in for 7 days for HYDROcodone-acetaminophen (NORCO) 10-325 MG tablet  Then kill the rest of the Rx and send in 2 Rx with the regular amount  [id number] - [place], [place] - [address] AT [address] Phone:  [phone]  Fax:  [phone]

## 2020-05-07 NOTE — Telephone Encounter (Signed)
Please advise if pt needs a visit for Rx refill

## 2020-05-07 NOTE — Telephone Encounter (Signed)
He will need a PMV for this (it has been 3 months)

## 2020-05-07 NOTE — Telephone Encounter (Signed)
Spoke with pt advised that he needs a PMV appointment

## 2020-05-10 NOTE — Telephone Encounter (Signed)
Patient is returning a call from Nancy.  Please advise.  

## 2020-05-10 NOTE — Telephone Encounter (Signed)
Left a detailed message for pt to call the office and schedule a PMV for refill on his Hydrocodone

## 2020-05-10 NOTE — Telephone Encounter (Signed)
Spoke with pt pharmacy verified that pt is due to pick up his refill for Hydrocodone on 05/11/2020 60 tab, Pt verbalized understanding that he needs PMV for renewal of the medication

## 2020-05-12 ENCOUNTER — Encounter: Payer: Self-pay | Admitting: Family Medicine

## 2020-05-12 ENCOUNTER — Telehealth (INDEPENDENT_AMBULATORY_CARE_PROVIDER_SITE_OTHER): Payer: Self-pay | Admitting: Family Medicine

## 2020-05-12 DIAGNOSIS — M5442 Lumbago with sciatica, left side: Secondary | ICD-10-CM

## 2020-05-12 DIAGNOSIS — G8929 Other chronic pain: Secondary | ICD-10-CM

## 2020-05-12 DIAGNOSIS — F1190 Opioid use, unspecified, uncomplicated: Secondary | ICD-10-CM

## 2020-05-12 DIAGNOSIS — M65331 Trigger finger, right middle finger: Secondary | ICD-10-CM

## 2020-05-12 MED ORDER — HYDROCODONE-ACETAMINOPHEN 10-325 MG PO TABS: 1.0000 | ORAL_TABLET | ORAL | 0 refills | Status: DC | PRN

## 2020-05-12 MED ORDER — AMPHETAMINE-DEXTROAMPHETAMINE 30 MG PO TABS: 30.0000 mg | ORAL_TABLET | Freq: Three times a day (TID) | ORAL | 0 refills | Status: DC

## 2020-05-12 MED ORDER — METHOCARBAMOL 500 MG PO TABS: 500.0000 mg | ORAL_TABLET | Freq: Four times a day (QID) | ORAL | 5 refills | Status: DC | PRN

## 2020-05-12 MED ORDER — TADALAFIL 10 MG PO TABS: 10.0000 mg | ORAL_TABLET | Freq: Every day | ORAL | 3 refills | Status: DC | PRN

## 2020-05-12 MED ORDER — BUSPIRONE HCL 10 MG PO TABS: 10.0000 mg | ORAL_TABLET | Freq: Three times a day (TID) | ORAL | 5 refills | Status: DC

## 2020-05-12 MED ORDER — DIAZEPAM 10 MG PO TABS: 10.0000 mg | ORAL_TABLET | Freq: Two times a day (BID) | ORAL | 5 refills | Status: DC | PRN

## 2020-05-12 NOTE — Progress Notes (Signed)
   Subjective:    Patient ID: Steven Wilkerson, male    DOB: February 10, 1961, 60 y.o.   MRN: [id number]  HPI Virtual Visit via Telephone Note  I connected with the patient on 05/12/20 at  1:30 PM EDT by telephone and verified that I am speaking with the correct person using two identifiers.   I discussed the limitations, risks, security and privacy concerns of performing an evaluation and management service by telephone and the availability of in person appointments. I also discussed with the patient that there may be a patient responsible charge related to this service. The patient expressed understanding and agreed to proceed.  Location patient: home Location provider: work or home office Participants present for the call: patient, provider Patient did not have a visit in the prior 7 days to address this/these issue(s).   History of Present Illness: Here for pain management. He is doing well. He is changing pharmacies for better pricing.  Indication for chronic opioid: low back pain Medication and dose: Norco 10-325 # pills per month: 180 Last UDS date: 02-11-20 Opioid Treatment Agreement signed (Y/N): 03-20-17 Opioid Treatment Agreement last reviewed with patient:  05-12-20 NCCSRS reviewed this encounter (include red flags): Yes    Observations/Objective: Patient sounds cheerful and well on the phone. I do not appreciate any SOB. Speech and thought processing are grossly intact. Patient reported vitals:  Assessment and Plan: Pain management, meds were refilled.  Gershon Crane, MD   Follow Up Instructions:     [phone] 5-10 [phone] 11-20 9443 21-30 I did not refer this patient for an OV in the next 24 hours for this/these issue(s).  I discussed the assessment and treatment plan with the patient. The patient was provided an opportunity to ask questions and all were answered. The patient agreed with the plan and demonstrated an understanding of the instructions.   The patient was  advised to call back or seek an in-person evaluation if the symptoms worsen or if the condition fails to improve as anticipated.  I provided 15 minutes of non-face-to-face time during this encounter.   Gershon Crane, MD    Review of Systems     Objective:   Physical Exam        Assessment & Plan:

## 2020-08-09 ENCOUNTER — Other Ambulatory Visit: Payer: Self-pay

## 2020-08-09 ENCOUNTER — Encounter: Payer: Self-pay | Admitting: Family Medicine

## 2020-08-09 ENCOUNTER — Telehealth (INDEPENDENT_AMBULATORY_CARE_PROVIDER_SITE_OTHER): Payer: Self-pay | Admitting: Family Medicine

## 2020-08-09 DIAGNOSIS — F1190 Opioid use, unspecified, uncomplicated: Secondary | ICD-10-CM

## 2020-08-09 DIAGNOSIS — G8929 Other chronic pain: Secondary | ICD-10-CM

## 2020-08-09 DIAGNOSIS — M5442 Lumbago with sciatica, left side: Secondary | ICD-10-CM

## 2020-08-09 MED ORDER — HYDROCODONE-ACETAMINOPHEN 10-325 MG PO TABS: 1.0000 | ORAL_TABLET | ORAL | 0 refills | Status: DC | PRN

## 2020-08-09 MED ORDER — AMPHETAMINE-DEXTROAMPHETAMINE 30 MG PO TABS: 30.0000 mg | ORAL_TABLET | Freq: Three times a day (TID) | ORAL | 0 refills | Status: DC

## 2020-08-09 MED ORDER — AMPHETAMINE-DEXTROAMPHETAMINE 30 MG PO TABS
30.0000 mg | ORAL_TABLET | Freq: Three times a day (TID) | ORAL | 0 refills | Status: DC
Start: 2020-10-11 — End: 2020-11-02

## 2020-08-09 NOTE — Progress Notes (Signed)
   Subjective:    Patient ID: Steven Wilkerson, male    DOB: 02-May-1960, 60 y.o.   MRN: [id number]  HPI Virtual Visit via Telephone Note  I connected with the patient on 08/09/20 at 11:00 AM EDT by telephone and verified that I am speaking with the correct person using two identifiers.   I discussed the limitations, risks, security and privacy concerns of performing an evaluation and management service by telephone and the availability of in person appointments. I also discussed with the patient that there may be a patient responsible charge related to this service. The patient expressed understanding and agreed to proceed.  Location patient: home Location provider: work or home office Participants present for the call: patient, provider Patient did not have a visit in the prior 7 days to address this/these issue(s).   History of Present Illness: Here for pain management, he is doing well.    Observations/Objective: Patient sounds cheerful and well on the phone. I do not appreciate any SOB. Speech and thought processing are grossly intact. Patient reported vitals:  Assessment and Plan: Pain management. Indication for chronic opioid: low back pain Medication and dose: Norco 10-325 # pills per month: 180 Last UDS date: 02-11-20 Opioid Treatment Agreement signed (Y/N): 03-20-17 Opioid Treatment Agreement last reviewed with patient:  08-09-20 NCCSRS reviewed this encounter (include red flags): Yes Meds were refilled.  Gershon Crane, MD   Follow Up Instructions:     [phone] 5-10 [phone] 11-20 9443 21-30 I did not refer this patient for an OV in the next 24 hours for this/these issue(s).  I discussed the assessment and treatment plan with the patient. The patient was provided an opportunity to ask questions and all were answered. The patient agreed with the plan and demonstrated an understanding of the instructions.   The patient was advised to call back or seek an in-person  evaluation if the symptoms worsen or if the condition fails to improve as anticipated.  I provided 14 minutes of non-face-to-face time during this encounter.   Gershon Crane, MD     Review of Systems     Objective:   Physical Exam        Assessment & Plan:

## 2020-10-04 ENCOUNTER — Telehealth: Payer: Self-pay

## 2020-10-04 NOTE — Telephone Encounter (Signed)
Patient called wanting to know if back pain is relating to a vertical shift of the spine he heard on a podcast pt stated he would like to talk to Dr. Clent Ridges about possible surgery referral

## 2020-10-06 NOTE — Telephone Encounter (Addendum)
Lvm for patient to call office and schedule virtual visit to discuss questions/ surgery referral

## 2020-11-01 ENCOUNTER — Telehealth: Payer: Self-pay | Admitting: Family Medicine

## 2020-11-02 ENCOUNTER — Encounter: Payer: Self-pay | Admitting: Family Medicine

## 2020-11-02 ENCOUNTER — Telehealth (INDEPENDENT_AMBULATORY_CARE_PROVIDER_SITE_OTHER): Payer: Self-pay | Admitting: Family Medicine

## 2020-11-02 DIAGNOSIS — G8929 Other chronic pain: Secondary | ICD-10-CM

## 2020-11-02 DIAGNOSIS — F1190 Opioid use, unspecified, uncomplicated: Secondary | ICD-10-CM

## 2020-11-02 DIAGNOSIS — M5442 Lumbago with sciatica, left side: Secondary | ICD-10-CM

## 2020-11-02 MED ORDER — AMPHETAMINE-DEXTROAMPHETAMINE 30 MG PO TABS: 30.0000 mg | ORAL_TABLET | Freq: Three times a day (TID) | ORAL | 0 refills | Status: DC

## 2020-11-02 MED ORDER — AMPHETAMINE-DEXTROAMPHETAMINE 30 MG PO TABS
30.0000 mg | ORAL_TABLET | Freq: Three times a day (TID) | ORAL | 0 refills | Status: DC
Start: 2020-12-10 — End: 2020-11-02

## 2020-11-02 MED ORDER — HYDROCODONE-ACETAMINOPHEN 10-325 MG PO TABS: 1.0000 | ORAL_TABLET | ORAL | 0 refills | Status: DC | PRN

## 2020-11-02 NOTE — Progress Notes (Signed)
   Subjective:    Patient ID: Steven Wilkerson, male    DOB: February 13, 1961, 60 y.o.   MRN: [id number]  HPI Virtual Visit via Telephone Note  I connected with the patient on 11/02/20 at  1:00 PM EDT by telephone and verified that I am speaking with the correct person using two identifiers.   I discussed the limitations, risks, security and privacy concerns of performing an evaluation and management service by telephone and the availability of in person appointments. I also discussed with the patient that there may be a patient responsible charge related to this service. The patient expressed understanding and agreed to proceed.  Location patient: home Location provider: work or home office Participants present for the call: patient, provider Patient did not have a visit in the prior 7 days to address this/these issue(s).   History of Present Illness: Here for pain management, he is doing well.    Observations/Objective: Patient sounds cheerful and well on the phone. I do not appreciate any SOB. Speech and thought processing are grossly intact. Patient reported vitals:  Assessment and Plan: Pain management. Indication for chronic opioid: low back pain Medication and dose: Norco10-325 # pills per month: 180 Last UDS date: 02-11-20 Opioid Treatment Agreement signed (Y/N): 03-20-17 Opioid Treatment Agreement last reviewed with patient:  11-02-20 NCCSRS reviewed this encounter (include red flags): Yes Meds were refilled.  Gershon Crane, MD   Follow Up Instructions:     [phone] 5-10 [phone] 11-20 9443 21-30 I did not refer this patient for an OV in the next 24 hours for this/these issue(s).  I discussed the assessment and treatment plan with the patient. The patient was provided an opportunity to ask questions and all were answered. The patient agreed with the plan and demonstrated an understanding of the instructions.   The patient was advised to call back or seek an in-person  evaluation if the symptoms worsen or if the condition fails to improve as anticipated.  I provided 15 minutes of non-face-to-face time during this encounter.   Gershon Crane, MD     Review of Systems     Objective:   Physical Exam        Assessment & Plan:

## 2020-11-03 ENCOUNTER — Telehealth: Payer: Self-pay

## 2020-11-03 NOTE — Telephone Encounter (Signed)
Done

## 2020-11-03 NOTE — Telephone Encounter (Signed)
Pharmacy called stating that do not have Rx HYDROcodone-acetaminophen (NORCO) 10-325 MG tablet in stock and patient requested Rx be transferred to [facility] needs a call back to cancel Rx at their location

## 2020-11-03 NOTE — Telephone Encounter (Signed)
Please resend Hydrocodone 10-325 mg to [facility] on [address].  Pharmacy has been added.

## 2020-11-05 ENCOUNTER — Telehealth: Payer: Self-pay

## 2020-11-05 NOTE — Telephone Encounter (Signed)
I already refilled this on 11-02-20

## 2020-11-05 NOTE — Telephone Encounter (Addendum)
Patient called requesting Rx refill amphetamine-dextroamphetamine (ADDERALL) 30 MG tablet To be sent to [facility] Patient called back to check the status of the refill

## 2020-11-05 NOTE — Telephone Encounter (Signed)
Rx already sent to pt pharmacy on 11/02/2020

## 2020-11-06 ENCOUNTER — Encounter: Payer: Self-pay | Admitting: Family Medicine

## 2020-11-08 ENCOUNTER — Telehealth: Payer: Self-pay

## 2020-11-08 MED ORDER — AMPHETAMINE-DEXTROAMPHETAMINE 30 MG PO TABS: 30.0000 mg | ORAL_TABLET | Freq: Three times a day (TID) | ORAL | 0 refills | Status: DC

## 2020-11-08 NOTE — Telephone Encounter (Signed)
Message sent to DR Clent Ridges for Advise

## 2020-11-08 NOTE — Addendum Note (Signed)
Addended by: Gershon Crane A on: 11/08/2020 05:06 PM   Modules accepted: Orders

## 2020-11-08 NOTE — Telephone Encounter (Signed)
Message sent to Dr Fry for advise 

## 2020-11-08 NOTE — Telephone Encounter (Signed)
PT called in regards to his mychart msg he sent to Dr.Fry. Where it states that the [facility] that he normally gets it is unable to fill the adderall and he needs it sent to the [facility] on [address]. Please call the PT with a status to let him know what is going on.

## 2020-11-08 NOTE — Telephone Encounter (Signed)
Done for one month  ?

## 2020-11-08 NOTE — Telephone Encounter (Signed)
Patient is requesting a phone call.  Patient could be contacted at [phone].  Please advise.

## 2020-11-09 NOTE — Telephone Encounter (Signed)
Sent reply back to patient, via my chart that Adderall 30mg , has been sent to on [facility].

## 2021-02-02 ENCOUNTER — Encounter: Payer: Self-pay | Admitting: Family Medicine

## 2021-02-02 ENCOUNTER — Telehealth (INDEPENDENT_AMBULATORY_CARE_PROVIDER_SITE_OTHER): Payer: Self-pay | Admitting: Family Medicine

## 2021-02-02 DIAGNOSIS — M5442 Lumbago with sciatica, left side: Secondary | ICD-10-CM

## 2021-02-02 DIAGNOSIS — G8929 Other chronic pain: Secondary | ICD-10-CM

## 2021-02-02 DIAGNOSIS — F1190 Opioid use, unspecified, uncomplicated: Secondary | ICD-10-CM

## 2021-02-02 MED ORDER — AMPHETAMINE-DEXTROAMPHETAMINE 30 MG PO TABS: 30.0000 mg | ORAL_TABLET | Freq: Three times a day (TID) | ORAL | 0 refills | Status: DC

## 2021-02-02 MED ORDER — OXYCODONE HCL 10 MG PO TABS: 10.0000 mg | ORAL_TABLET | Freq: Four times a day (QID) | ORAL | 0 refills | Status: DC | PRN

## 2021-02-02 MED ORDER — OXYCODONE HCL 10 MG PO TABS
10.0000 mg | ORAL_TABLET | Freq: Four times a day (QID) | ORAL | 0 refills | Status: DC | PRN
Start: 2021-04-05 — End: 2021-03-02

## 2021-02-02 MED ORDER — OXYCODONE HCL 10 MG PO TABS
10.0000 mg | ORAL_TABLET | Freq: Four times a day (QID) | ORAL | 0 refills | Status: DC | PRN
Start: 2021-03-05 — End: 2021-02-02

## 2021-02-02 NOTE — Progress Notes (Signed)
° °  Subjective:    Patient ID: Steven Wilkerson, male    DOB: May 03, 1960, 60 y.o.   MRN: [id number]  HPI Virtual Visit via Telephone Note  I connected with the patient on 02/02/21 at  4:00 PM EST by telephone and verified that I am speaking with the correct person using two identifiers.   I discussed the limitations, risks, security and privacy concerns of performing an evaluation and management service by telephone and the availability of in person appointments. I also discussed with the patient that there may be a patient responsible charge related to this service. The patient expressed understanding and agreed to proceed.  Location patient: home Location provider: work or home office Participants present for the call: patient, provider Patient did not have a visit in the prior 7 days to address this/these issue(s).   History of Present Illness: Here for pain management. He says his pain has gotten worse and now the Norco no longer controls this. He asks to try Oxycodone instead. The Methocarbamol is helpful.    Observations/Objective: Patient sounds cheerful and well on the phone. I do not appreciate any SOB. Speech and thought processing are grossly intact. Patient reported vitals:  Assessment and Plan: Pain management. Indication for chronic opioid: low back oain Medication and dose: Oxycodone 10 mg # pills per month: 120 Last UDS date: 02-11-20 Opioid Treatment Agreement signed (Y/N): 03-20-17 Opioid Treatment Agreement last reviewed with patient:  02-02-21 NCCSRS reviewed this encounter (include red flags): Yes We changed to Oxycodone as above and sent in refilld for 3 months. After his insurance changes in January he will get back in touch with [place] so we can set up an MRI of his lumbar spine. Gershon Crane, MD   Follow Up Instructions:     [phone] 5-10 [phone] 11-20 9443 21-30 I did not refer this patient for an OV in the next 24 hours for this/these issue(s).  I  discussed the assessment and treatment plan with the patient. The patient was provided an opportunity to ask questions and all were answered. The patient agreed with the plan and demonstrated an understanding of the instructions.   The patient was advised to call back or seek an in-person evaluation if the symptoms worsen or if the condition fails to improve as anticipated.  I provided 17 minutes of non-face-to-face time during this encounter.   Gershon Crane, MD     Review of Systems     Objective:   Physical Exam        Assessment & Plan:

## 2021-02-06 ENCOUNTER — Other Ambulatory Visit: Payer: Self-pay | Admitting: Family Medicine

## 2021-02-07 ENCOUNTER — Other Ambulatory Visit: Payer: Self-pay

## 2021-02-07 NOTE — Telephone Encounter (Signed)
Last refill-11/02/20-by historical provider Last VV- 02/02/21  No future appointment scheduled.  Can this patient receive a refill?

## 2021-02-18 ENCOUNTER — Telehealth: Payer: Self-pay | Admitting: Family Medicine

## 2021-02-18 NOTE — Telephone Encounter (Signed)
Pt had virtual appt with dr fry on 02-02-2021. Pt is calling his pain level is 15 on a scale 1-10 . Pt is waiting on his insurance to see about getting mri of his back. Pt would like to know if md could increase the amount of oxycodone he takes a day or change strength to oxycodone 15 mg instead of 10  mg  [facility] [id number] [place], [place] - [address] Phone:  [phone]  Fax:  [phone]

## 2021-02-21 ENCOUNTER — Encounter: Payer: Self-pay | Admitting: Family Medicine

## 2021-02-21 DIAGNOSIS — G8929 Other chronic pain: Secondary | ICD-10-CM

## 2021-02-22 MED ORDER — OXYCODONE HCL 15 MG PO TABS: 15.0000 mg | ORAL_TABLET | ORAL | 0 refills | Status: DC | PRN

## 2021-02-22 NOTE — Telephone Encounter (Signed)
See my answer to the telephone message

## 2021-02-22 NOTE — Telephone Encounter (Signed)
I sent in for #30 of the 15 mg size. If he wants any more he will need to see me in the office for an exam

## 2021-02-22 NOTE — Telephone Encounter (Signed)
Patient sent My Chart message concerning this information.    Pharmacy updated.

## 2021-02-23 NOTE — Telephone Encounter (Signed)
Reply message sent to patient via My Chart.  

## 2021-03-02 MED ORDER — OXYCODONE HCL 15 MG PO TABS: 15.0000 mg | ORAL_TABLET | Freq: Four times a day (QID) | ORAL | 0 refills | Status: DC | PRN

## 2021-03-02 NOTE — Telephone Encounter (Signed)
Tell him I sent in a refill for the 15 mg Oxycodone. He already has refills available for the Adderall until 05-05-21

## 2021-03-02 NOTE — Addendum Note (Signed)
Addended by: Gershon Crane A on: 03/02/2021 01:22 PM   Modules accepted: Orders

## 2021-03-23 MED ORDER — OXYCODONE HCL 15 MG PO TABS: 15.0000 mg | ORAL_TABLET | Freq: Four times a day (QID) | ORAL | 0 refills | Status: DC | PRN

## 2021-03-23 NOTE — Telephone Encounter (Signed)
I sent in the refill for Oxycodone dated 03-31-21 , and I ordered the MRI scan

## 2021-03-23 NOTE — Addendum Note (Signed)
Addended by: Gershon Crane A on: 03/23/2021 02:47 PM   Modules accepted: Orders

## 2021-03-25 ENCOUNTER — Telehealth: Payer: Self-pay | Admitting: Family Medicine

## 2021-03-25 NOTE — Telephone Encounter (Signed)
Pt requesting Oxycodone ER.   Please advise

## 2021-03-25 NOTE — Telephone Encounter (Signed)
Patient called in to request a refill for an extended version of the oxyCODONE. He stated that he called in yesterday regarding this but I cannot find anything in chart.  Patient could be contacted at [phone].  Please advise.

## 2021-03-25 NOTE — Telephone Encounter (Signed)
We need to stick with the immediate release Oxycodone. There has been a shortage of the ER type for awhile

## 2021-03-28 NOTE — Telephone Encounter (Signed)
No they are asking [place] to not prescribe both forms of this medication at the same time

## 2021-03-28 NOTE — Telephone Encounter (Signed)
Send pt a MyChart message with Dr Clent Ridges

## 2021-03-31 ENCOUNTER — Encounter: Payer: Self-pay | Admitting: Family Medicine

## 2021-03-31 ENCOUNTER — Telehealth: Payer: Self-pay

## 2021-03-31 NOTE — Telephone Encounter (Signed)
Your information has been submitted and will be reviewed by [place].    An electronic determination will be received in CoverMyMeds within 72-120 hours. . You will receive a fax copy of the determination. If [facility] has not responded in 120 hours, contact [employer] at [phone].

## 2021-04-12 ENCOUNTER — Telehealth: Payer: Self-pay | Admitting: Family Medicine

## 2021-04-12 NOTE — Telephone Encounter (Signed)
Patient is requesting a refill for amphetamine-dextroamphetamine (ADDERALL) 30 MG tablet [[id number] to be sent to another pharmacy due to his pharmacy being out.  The [facility] pharmacy is [facility] at [address] [place], [place] [address].  Please advise.

## 2021-04-13 MED ORDER — AMPHETAMINE-DEXTROAMPHETAMINE 30 MG PO TABS: 30.0000 mg | ORAL_TABLET | Freq: Three times a day (TID) | ORAL | 0 refills | Status: DC

## 2021-04-13 NOTE — Telephone Encounter (Signed)
Done

## 2021-04-13 NOTE — Telephone Encounter (Signed)
Pharmacy updated.

## 2021-04-13 NOTE — Telephone Encounter (Addendum)
Message complete pharmacy notify pt when ready for pickup

## 2021-04-22 ENCOUNTER — Encounter: Payer: Self-pay | Admitting: Family Medicine

## 2021-04-22 ENCOUNTER — Telehealth: Payer: Self-pay | Admitting: Family Medicine

## 2021-04-22 NOTE — Telephone Encounter (Signed)
ATC but could not leave vm due to mailbox being full 

## 2021-04-22 NOTE — Telephone Encounter (Signed)
He will need another PMV for this (it's time anyway)  ?

## 2021-04-22 NOTE — Telephone Encounter (Signed)
Pt no longer want to take oxycodone and would like to go back on hydrocodone 10 mg . Pt will take 6 pills a day #180 sent to new pharm  ?[facility] [address] [place], [place] - [address] Phone:  [phone]  ?Fax:  [phone]  ?  ? ?

## 2021-04-22 NOTE — Telephone Encounter (Signed)
He is due for another PMV, so we can discuss this change at that time  ?

## 2021-04-25 NOTE — Telephone Encounter (Signed)
We will need to discuss this tomorrow  ?

## 2021-04-25 NOTE — Telephone Encounter (Signed)
Pt has been scheduled for 03/07 ?

## 2021-04-26 ENCOUNTER — Encounter: Payer: Self-pay | Admitting: Family Medicine

## 2021-04-26 ENCOUNTER — Telehealth (INDEPENDENT_AMBULATORY_CARE_PROVIDER_SITE_OTHER): Payer: Self-pay | Admitting: Family Medicine

## 2021-04-26 DIAGNOSIS — M5442 Lumbago with sciatica, left side: Secondary | ICD-10-CM

## 2021-04-26 DIAGNOSIS — G8929 Other chronic pain: Secondary | ICD-10-CM

## 2021-04-26 DIAGNOSIS — F1190 Opioid use, unspecified, uncomplicated: Secondary | ICD-10-CM

## 2021-04-26 MED ORDER — HYDROCODONE-ACETAMINOPHEN 10-325 MG PO TABS: 1.0000 | ORAL_TABLET | ORAL | 0 refills | Status: DC | PRN

## 2021-04-26 MED ORDER — AMPHETAMINE-DEXTROAMPHETAMINE 30 MG PO TABS: 30.0000 mg | ORAL_TABLET | Freq: Three times a day (TID) | ORAL | 0 refills | Status: DC

## 2021-04-26 NOTE — Progress Notes (Signed)
? ?  Subjective:  ? ? Patient ID: Steven Wilkerson, male    DOB: [birth year], 61 y.o.   MRN: [id number] ? ?HPI ?Virtual Visit via Telephone Note ? ?I connected with the patient on 04/26/21 at 10:45 AM EST by telephone and verified that I am speaking with the correct person using two identifiers. ?  ?I discussed the limitations, risks, security and privacy concerns of performing an evaluation and management service by telephone and the availability of in person appointments. I also discussed with the patient that there may be a patient responsible charge related to this service. The patient expressed understanding and agreed to proceed. ? ?Location patient: home ?Location provider: work or home office ?Participants present for the call: patient, provider ?Patient did not have a visit in the prior 7 days to address this/these issue(s). ? ? ?History of Present Illness: ?Here for pain management. His back pain has been worse lately, and he asks to change from Oxycodone back to Norco.  ?  ?Observations/Objective: ?Patient sounds cheerful and well on the phone. ?I do not appreciate any SOB. ?Speech and thought processing are grossly intact. ?Patient reported vitals: ? ?Assessment and Plan: ?Pain management.  ?Indication for chronic opioid: low back pain ?Medication and dose: Norco 10-325 ?# pills per month: 180 ?Last UDS date: 02-11-20 ?Opioid Treatment Agreement signed (Y/N): 03-20-17 ?Opioid Treatment Agreement last reviewed with patient:  04-26-21 ?NCCSRS reviewed this encounter (include red flags): Yes ?We agreed to stop Oxycodone go back on Norco as above. However I only sent in a 30 day supply. He will need to submit another UDS before we can do any further refills.  ?Gershon Crane, MD ? ? ?Follow Up Instructions: ? ? ? ? ?99441 5-10 ?99442 11-20 ?9443 21-30 ?I did not refer this patient for an OV in the next 24 hours for this/these issue(s). ? ?I discussed the assessment and treatment plan with the patient. The patient was  provided an opportunity to ask questions and all were answered. The patient agreed with the plan and demonstrated an understanding of the instructions. ?  ?The patient was advised to call back or seek an in-person evaluation if the symptoms worsen or if the condition fails to improve as anticipated. ? ?I provided 15 minutes of non-face-to-face time during this encounter. ? ? ?Gershon Crane, MD   ? ? ?Review of Systems ? ?   ?Objective:  ? Physical Exam ? ? ? ? ?   ?Assessment & Plan:  ? ? ?

## 2021-04-27 ENCOUNTER — Encounter: Payer: Self-pay | Admitting: Family Medicine

## 2021-04-27 ENCOUNTER — Telehealth: Payer: Self-pay

## 2021-04-27 MED ORDER — HYDROCODONE-ACETAMINOPHEN 10-325 MG PO TABS: 1.0000 | ORAL_TABLET | ORAL | 0 refills | Status: DC | PRN

## 2021-04-27 NOTE — Telephone Encounter (Signed)
Pt PA for Adderall 30 mg was sent to plan. ?[facility] is reviewing your PA request and will respond within 24 hours for Medicaid or up to 72 hours ?

## 2021-04-27 NOTE — Telephone Encounter (Signed)
I sent in for #138 pills. Do we still need to do a PA?  ?

## 2021-05-02 ENCOUNTER — Telehealth: Payer: Self-pay | Admitting: Family Medicine

## 2021-05-02 ENCOUNTER — Other Ambulatory Visit: Payer: Self-pay | Admitting: Family Medicine

## 2021-05-02 MED ORDER — HYDROCODONE-ACETAMINOPHEN 10-325 MG PO TABS: 1.0000 | ORAL_TABLET | ORAL | 0 refills | Status: DC | PRN

## 2021-05-02 NOTE — Telephone Encounter (Signed)
Pt stated he want a call back when it is sent to pt pharmacy . ?

## 2021-05-02 NOTE — Telephone Encounter (Signed)
Pt Hydrocodone refill was sent to wrong pharmacy, pt states to send it to the pharmacy listed on this encounter ?

## 2021-05-02 NOTE — Telephone Encounter (Signed)
Patient called in stating that the HYDROcodone-acetaminophen [facility]) 10-325 MG tablet [id number]  was sent to the wrong pharmacy. Patient stated that it was supposed to be sent to the [facility] at [address], [place], [place] [address]. ? ?Patient is currently out of the medication and needs it as soon as possible. ? ?Please advise. ?

## 2021-05-02 NOTE — Telephone Encounter (Signed)
error 

## 2021-05-02 NOTE — Telephone Encounter (Signed)
Done

## 2021-05-02 NOTE — Telephone Encounter (Signed)
Send pt a MyChart message.

## 2021-05-02 NOTE — Telephone Encounter (Signed)
This was taken care of

## 2021-05-06 NOTE — Telephone Encounter (Signed)
Rx already sent to pt pharmacy , pt notified ?

## 2021-05-09 ENCOUNTER — Other Ambulatory Visit: Payer: Self-pay | Admitting: Family Medicine

## 2021-05-12 ENCOUNTER — Encounter: Payer: Self-pay | Admitting: Family Medicine

## 2021-05-12 ENCOUNTER — Other Ambulatory Visit: Payer: Self-pay

## 2021-05-12 ENCOUNTER — Telehealth: Payer: Self-pay | Admitting: Family Medicine

## 2021-05-12 DIAGNOSIS — N521 Erectile dysfunction due to diseases classified elsewhere: Secondary | ICD-10-CM

## 2021-05-12 MED ORDER — BUSPIRONE HCL 10 MG PO TABS: 10.0000 mg | ORAL_TABLET | Freq: Three times a day (TID) | ORAL | 0 refills | Status: DC

## 2021-05-12 MED ORDER — AMPHETAMINE-DEXTROAMPHETAMINE 30 MG PO TABS: 30.0000 mg | ORAL_TABLET | Freq: Three times a day (TID) | ORAL | 0 refills | Status: DC

## 2021-05-12 MED ORDER — TADALAFIL 10 MG PO TABS: 10.0000 mg | ORAL_TABLET | Freq: Every day | ORAL | 0 refills | Status: DC | PRN

## 2021-05-12 NOTE — Telephone Encounter (Signed)
Last VV- 04/26/2021 ?Last refill-04/26/2021-90 tabs, 0 refills ? ?*My Chart message has been sent with same request.* ? ?No future OV scheduled ? ? ? ? ?

## 2021-05-12 NOTE — Telephone Encounter (Signed)
Done

## 2021-05-12 NOTE — Telephone Encounter (Signed)
Pt is calling and amphetamine-dextroamphetamine (ADDERALL) 30 MG tablet needs PA  ?[id number] - [place], [place] - [address] Phone:  [phone]  ?Fax:  [phone]  ?  ? ?

## 2021-05-13 NOTE — Telephone Encounter (Signed)
He already has refills until June 22 ?

## 2021-05-18 NOTE — Telephone Encounter (Signed)
Per Pharmacy: ?[facility] ?Bin [id number] ?PCN [id number] ?ID [id number] ?Group [id number] ? ?Updated insurance info needed. Last insurance card 02/11/20. Due to the amt of time needed to complete a PA, we will not start PA until patient has confirmed. LVM for pt to call back to confirm. This is the 2nd time he's been asked. ? ? ?

## 2021-05-19 NOTE — Telephone Encounter (Signed)
Left a message for pt to call office and provide correct ID number for his [employer] plan, PA is rejecting the PA due to invalid members ID  ?

## 2021-05-30 ENCOUNTER — Telehealth: Payer: Self-pay | Admitting: Family Medicine

## 2021-05-30 ENCOUNTER — Other Ambulatory Visit: Payer: Self-pay

## 2021-05-30 DIAGNOSIS — N521 Erectile dysfunction due to diseases classified elsewhere: Secondary | ICD-10-CM

## 2021-05-30 MED ORDER — METHOCARBAMOL 500 MG PO TABS: 500.0000 mg | ORAL_TABLET | Freq: Four times a day (QID) | ORAL | 0 refills | Status: DC | PRN

## 2021-05-30 MED ORDER — TADALAFIL 10 MG PO TABS: 10.0000 mg | ORAL_TABLET | Freq: Every day | ORAL | 1 refills | Status: DC | PRN

## 2021-05-30 MED ORDER — BUSPIRONE HCL 10 MG PO TABS: 10.0000 mg | ORAL_TABLET | Freq: Three times a day (TID) | ORAL | 1 refills | Status: DC

## 2021-05-30 NOTE — Telephone Encounter (Signed)
Patient called because he needs refill on HYDROcodone-acetaminophen (NORCO) 10-325 MG tablet  ? ? ? ?Patient would also like the rest of his prescriptions from Dr.Fry sent to this pharmacy as well. ? ?[facility] [address] [place], [place] - [address] AT [address] Phone:  [phone]  ?Fax:  [phone]  ?  ? ? ? ? ? ? ?Good callback number is  ?

## 2021-05-30 NOTE — Telephone Encounter (Signed)
Patient called because he is out of HYDROcodone-acetaminophen (NORCO) 10-325 MG tablet and needs a refill today. ? ? ? ? ?Please send to ? ? ?[facility] [address] [place], [place]. AT [place] Phone:  [phone]  ?Fax:  [phone]  ?  ? ? ? ? ?Please advise  ?

## 2021-05-31 ENCOUNTER — Encounter: Payer: Self-pay | Admitting: Family Medicine

## 2021-05-31 ENCOUNTER — Telehealth: Payer: Self-pay | Admitting: Family Medicine

## 2021-05-31 ENCOUNTER — Other Ambulatory Visit: Payer: Managed Care, Other (non HMO)

## 2021-05-31 DIAGNOSIS — F1190 Opioid use, unspecified, uncomplicated: Secondary | ICD-10-CM

## 2021-05-31 MED ORDER — HYDROCODONE-ACETAMINOPHEN 10-325 MG PO TABS: 1.0000 | ORAL_TABLET | ORAL | 0 refills | Status: DC | PRN

## 2021-05-31 MED ORDER — DIAZEPAM 10 MG PO TABS: ORAL_TABLET | ORAL | 5 refills | Status: DC

## 2021-05-31 MED ORDER — AMPHETAMINE-DEXTROAMPHETAMINE 30 MG PO TABS: 30.0000 mg | ORAL_TABLET | Freq: Three times a day (TID) | ORAL | 0 refills | Status: DC

## 2021-05-31 NOTE — Telephone Encounter (Signed)
The Buspar, methocarbamol, and tadalafil were sen tin yesterday. I just sent in the Valium. As I said in the other message reply, no refill son pain meds until he gets a UDS  ?

## 2021-05-31 NOTE — Telephone Encounter (Signed)
Patient is requesting a refill of Hydrocodone to be called in.  He is wanting to pick it up today on his way out of town.  Patient also needs a refill on Aderrall so he can pick it up in about a week.  ? ?Pharmacy- [facility] ?

## 2021-05-31 NOTE — Telephone Encounter (Signed)
Both were refilled.

## 2021-05-31 NOTE — Telephone Encounter (Signed)
Pt has lab appt today 05-31-2021 at 120 pm ?

## 2021-05-31 NOTE — Telephone Encounter (Signed)
Patient  sent My Chart message today requesting refill on Hydrocodone.     Requesting refill on Adderall.    Drug screen was complete today.  ?

## 2021-05-31 NOTE — Telephone Encounter (Signed)
As I said one month ago, we will not give any further refills on pain medications until  he comes by the lab to submit a urine sample for a drug screen. The order is already in place  ?

## 2021-05-31 NOTE — Telephone Encounter (Signed)
Noted  

## 2021-06-01 NOTE — Telephone Encounter (Signed)
Left detailed message for pt regarding his refills for Hydrocodone and Adderall ?

## 2021-06-01 NOTE — Telephone Encounter (Signed)
I did this yesterday. 

## 2021-06-01 NOTE — Telephone Encounter (Signed)
Sent pt a MyChart message

## 2021-06-01 NOTE — Telephone Encounter (Signed)
Send the message to pt via MyChart ?

## 2021-06-04 LAB — DRUG MONITOR, PANEL 1, W/CONF, URINE
Alphahydroxyalprazolam: NEGATIVE ng/mL (ref <25)
Alphahydroxymidazolam: NEGATIVE ng/mL (ref <50)
Alphahydroxytriazolam: NEGATIVE ng/mL (ref <50)
Aminoclonazepam: NEGATIVE ng/mL (ref <25)
Amphetamine: 7116 ng/mL — ABNORMAL HIGH (ref <250)
Amphetamines: POSITIVE ng/mL — AB (ref <500)
Barbiturates: NEGATIVE ng/mL (ref <300)
Benzodiazepines: POSITIVE ng/mL — AB (ref <100)
Cocaine Metabolite: NEGATIVE ng/mL (ref <150)
Creatinine: 84.3 mg/dL (ref >=20.0)
Hydroxyethylflurazepam: NEGATIVE ng/mL (ref <50)
Lorazepam: NEGATIVE ng/mL (ref <50)
Marijuana Metabolite: 52 ng/mL — ABNORMAL HIGH (ref <5)
Marijuana Metabolite: POSITIVE ng/mL — AB (ref <20)
Methadone Metabolite: NEGATIVE ng/mL (ref <100)
Methamphetamine: NEGATIVE ng/mL (ref <250)
Nordiazepam: 656 ng/mL — ABNORMAL HIGH (ref <50)
Opiates: NEGATIVE ng/mL (ref <100)
Oxazepam: 892 ng/mL — ABNORMAL HIGH (ref <50)
Oxidant: NEGATIVE ug/mL (ref <200)
Oxycodone: NEGATIVE ng/mL (ref <100)
Phencyclidine: NEGATIVE ng/mL (ref <25)
Temazepam: 1229 ng/mL — ABNORMAL HIGH (ref <50)
pH: 7.3 (ref 4.5–9.0)

## 2021-06-04 LAB — DM TEMPLATE

## 2021-06-08 NOTE — Telephone Encounter (Signed)
This request just needs to be denied, thanks! ?

## 2021-07-29 ENCOUNTER — Telehealth (INDEPENDENT_AMBULATORY_CARE_PROVIDER_SITE_OTHER): Payer: Self-pay | Admitting: Family Medicine

## 2021-07-29 ENCOUNTER — Telehealth: Payer: Self-pay | Admitting: Family Medicine

## 2021-07-29 ENCOUNTER — Encounter: Payer: Self-pay | Admitting: Family Medicine

## 2021-07-29 ENCOUNTER — Ambulatory Visit: Payer: Managed Care, Other (non HMO) | Admitting: Family Medicine

## 2021-07-29 DIAGNOSIS — F1190 Opioid use, unspecified, uncomplicated: Secondary | ICD-10-CM

## 2021-07-29 DIAGNOSIS — M5442 Lumbago with sciatica, left side: Secondary | ICD-10-CM

## 2021-07-29 DIAGNOSIS — G8929 Other chronic pain: Secondary | ICD-10-CM

## 2021-07-29 MED ORDER — AMPHETAMINE-DEXTROAMPHETAMINE 30 MG PO TABS: 30.0000 mg | ORAL_TABLET | Freq: Three times a day (TID) | ORAL | 0 refills | Status: DC

## 2021-07-29 MED ORDER — METHOCARBAMOL 500 MG PO TABS: 500.0000 mg | ORAL_TABLET | Freq: Four times a day (QID) | ORAL | 5 refills | Status: DC | PRN

## 2021-07-29 MED ORDER — AMPHETAMINE-DEXTROAMPHETAMINE 30 MG PO TABS
30.0000 mg | ORAL_TABLET | Freq: Three times a day (TID) | ORAL | 0 refills | Status: DC
Start: 2021-07-29 — End: 2021-07-29

## 2021-07-29 MED ORDER — OXYCODONE HCL 20 MG PO TABS
20.0000 mg | ORAL_TABLET | Freq: Four times a day (QID) | ORAL | 0 refills | Status: DC | PRN
Start: 2021-08-28 — End: 2021-07-29

## 2021-07-29 MED ORDER — OXYCODONE HCL 20 MG PO TABS: 20.0000 mg | ORAL_TABLET | Freq: Four times a day (QID) | ORAL | 0 refills | Status: DC | PRN

## 2021-07-29 MED ORDER — OXYCODONE HCL 20 MG PO TABS
20.0000 mg | ORAL_TABLET | Freq: Four times a day (QID) | ORAL | 0 refills | Status: AC | PRN
Start: 2021-07-29 — End: 2021-08-28

## 2021-07-29 NOTE — Telephone Encounter (Signed)
Pt call and stated he need the RXamphetamine-dextroamphetamine (ADDERALL) 30 MG tablet  sent to [facility] because [facility] don't have the quantity. He stated he need 120 also need it today

## 2021-07-29 NOTE — Telephone Encounter (Signed)
Attempted to reach pt.  Voicemail is full.

## 2021-07-29 NOTE — Progress Notes (Signed)
   Subjective:    Patient ID: Steven Wilkerson, male    DOB: 02-Aug-1960, 61 y.o.   MRN: [id number]  HPI Virtual Visit via Telephone Note  I connected with the patient on 07/29/21 at  8:30 AM EDT by telephone and verified that I am speaking with the correct person using two identifiers.   I discussed the limitations, risks, security and privacy concerns of performing an evaluation and management service by telephone and the availability of in person appointments. I also discussed with the patient that there may be a patient responsible charge related to this service. The patient expressed understanding and agreed to proceed.  Location patient: home Location provider: work or home office Participants present for the call: patient, provider Patient did not have a visit in the prior 7 days to address this/these issue(s).   History of Present Illness: Here for pain management. His back pain has been getting a little worse. He asks to change from hydrocodone back to oxycodone. We had problems getting his old insurance company [employer]) to approve the lumbar spine MRI we had ordered, but he recently changed to another company [employer]) so hopefully we can get the scan completed asap.    Observations/Objective: Patient sounds cheerful and well on the phone. I do not appreciate any SOB. Speech and thought processing are grossly intact. Patient reported vitals:  Assessment and Plan: Pain management. Indication for chronic opioid: low back pain Medication and dose: Oxycodone 20 mg # pills per month: 120 Last UDS date: 05-31-21 Opioid Treatment Agreement signed (Y/N): 03-20-17 Opioid Treatment Agreement last reviewed with patient:  07-29-21 NCCSRS reviewed this encounter (include red flags): Yes These medications were ordered.  Gershon Crane, MD   Follow Up Instructions:     [phone] [phone] 11-20 [phone] 21-30 I did not refer this patient for an OV in the next 24 hours for this/these  issue(s).  I discussed the assessment and treatment plan with the patient. The patient was provided an opportunity to ask questions and all were answered. The patient agreed with the plan and demonstrated an understanding of the instructions.   The patient was advised to call back or seek an in-person evaluation if the symptoms worsen or if the condition fails to improve as anticipated.  I provided 19 minutes of non-face-to-face time during this encounter.   Gershon Crane, MD     Review of Systems     Objective:   Physical Exam        Assessment & Plan:

## 2021-07-29 NOTE — Telephone Encounter (Signed)
Done

## 2021-07-29 NOTE — Telephone Encounter (Signed)
Please check if he means the Adderall or the Oxycodone

## 2021-07-29 NOTE — Telephone Encounter (Signed)
Pt requests for Oxycodone 20 mg to go to [facility] on [facility] since [facility] does not have requested quantity. Please advise

## 2021-10-21 ENCOUNTER — Telehealth: Payer: Self-pay | Admitting: Family Medicine

## 2021-10-21 NOTE — Telephone Encounter (Signed)
Pt is calling and he is aware dr fry out of the office until 10-25-2021. Pt does have a mychart video appt on 10-25-2021. Pt would like a early refill on oxycodone 20 mg.  [facility] [address] [place], [place] - [address]. AT [facility] [address] Phone:  [phone]  Fax:  [phone]

## 2021-10-21 NOTE — Telephone Encounter (Signed)
Last OV 07/29/21  Confirmed with pt that early refill is not possible since PCP is out of office. Pt verb understanding.

## 2021-10-25 ENCOUNTER — Encounter: Payer: Self-pay | Admitting: Family Medicine

## 2021-10-25 ENCOUNTER — Telehealth (INDEPENDENT_AMBULATORY_CARE_PROVIDER_SITE_OTHER): Payer: Self-pay | Admitting: Family Medicine

## 2021-10-25 DIAGNOSIS — M5442 Lumbago with sciatica, left side: Secondary | ICD-10-CM

## 2021-10-25 DIAGNOSIS — F1190 Opioid use, unspecified, uncomplicated: Secondary | ICD-10-CM

## 2021-10-25 DIAGNOSIS — G8929 Other chronic pain: Secondary | ICD-10-CM

## 2021-10-25 MED ORDER — AMPHETAMINE-DEXTROAMPHETAMINE 30 MG PO TABS: 30.0000 mg | ORAL_TABLET | Freq: Three times a day (TID) | ORAL | 0 refills | Status: DC

## 2021-10-25 MED ORDER — OXYCODONE HCL 20 MG PO TABS: 20.0000 mg | ORAL_TABLET | ORAL | 0 refills | Status: DC | PRN

## 2021-10-25 NOTE — Progress Notes (Signed)
   Subjective:    Patient ID: Steven Wilkerson, male    DOB: [birth year], 61 y.o.   MRN: [id number]  HPI Virtual Visit via Telephone Note  I connected with the patient on 10/25/21 at  1:30 PM EDT by telephone and verified that I am speaking with the correct person using two identifiers.   I discussed the limitations, risks, security and privacy concerns of performing an evaluation and management service by telephone and the availability of in person appointments. I also discussed with the patient that there may be a patient responsible charge related to this service. The patient expressed understanding and agreed to proceed.  Location patient: home Location provider: work or home office Participants present for the call: patient, provider Patient did not have a visit in the prior 7 days to address this/these issue(s).   History of Present Illness: Here for pain management. He says his pain has increased to where the Oxycodone pills seem to wear off after a few hours. He asks if he can take them more often. He is still able to work full time.    Observations/Objective: Patient sounds cheerful and well on the phone. I do not appreciate any SOB. Speech and thought processing are grossly intact. Patient reported vitals:  Assessment and Plan: Pain management. Indication for chronic opioid: low back pain  Medication and dose: Oxycodone 20 mg  # pills per month: 180 Last UDS date: 05-31-21 Opioid Treatment Agreement signed (Y/N): 03-20-17 Opioid Treatment Agreement last reviewed with patient:  10-25-21 NCCSRS reviewed this encounter (include red flags): Yes We will increase the dose to every 4 hours as needed.  He is still trying to get the MRI that we ordered approved.  Gershon Crane, MD   Follow Up Instructions:     [phone] 5-10 [phone] 11-20 [phone] 21-30 I did not refer this patient for an OV in the next 24 hours for this/these issue(s).  I discussed the assessment and treatment plan  with the patient. The patient was provided an opportunity to ask questions and all were answered. The patient agreed with the plan and demonstrated an understanding of the instructions.   The patient was advised to call back or seek an in-person evaluation if the symptoms worsen or if the condition fails to improve as anticipated.  I provided 15 minutes of non-face-to-face time during this encounter.   Gershon Crane, MD     Review of Systems     Objective:   Physical Exam        Assessment & Plan:

## 2021-10-27 ENCOUNTER — Telehealth: Payer: Self-pay | Admitting: Family Medicine

## 2021-10-27 NOTE — Telephone Encounter (Signed)
Pt is calling and need  new rx oxycodone 20 mg  sent to  [facility] [address] - [place], [place] - [address] [facility] AT [facility] [address] Phone:  [phone]  Fax:  [phone]    They have oxycodone in stock per pt

## 2021-10-27 NOTE — Telephone Encounter (Signed)
Prescription Oxycodone HCl 20 MG TABS has an earliest fill date of 12/27/21. Calling to see if it can be re-written for patient to pick up now

## 2021-10-28 NOTE — Telephone Encounter (Signed)
I see he picked these up yesterday at [facility]

## 2021-11-02 NOTE — Telephone Encounter (Signed)
Noted  

## 2021-11-25 ENCOUNTER — Other Ambulatory Visit: Payer: Self-pay | Admitting: Family Medicine

## 2021-11-28 NOTE — Telephone Encounter (Signed)
FYI Please close this request, not able to since its a control

## 2021-11-28 NOTE — Telephone Encounter (Signed)
Rx was D/C by Dr Sarajane Jews  on 05/31/21, pt is on Oxycodone

## 2021-12-14 ENCOUNTER — Telehealth: Payer: Self-pay | Admitting: Family Medicine

## 2021-12-14 NOTE — Telephone Encounter (Signed)
Pt called to say he is almost out of the  amphetamine-dextroamphetamine (ADDERALL) 30 MG tablet And the only reason he has even a few left is because he has been rationing them, since many pharmacies are on backorder.  Pt is asking for MD to please refill as soon as possible.  LVV:  10/25/21                *PLEASE SEND TO: [id number] [place], [place]. AT [place] Phone:  [phone]  Fax:  [phone]

## 2021-12-15 NOTE — Telephone Encounter (Signed)
Pt LOV was on 10/25/2021 Last refill done on 10/25/2021 Please advise

## 2021-12-16 MED ORDER — AMPHETAMINE-DEXTROAMPHETAMINE 30 MG PO TABS: 30.0000 mg | ORAL_TABLET | Freq: Three times a day (TID) | ORAL | 0 refills | Status: DC

## 2021-12-16 NOTE — Addendum Note (Signed)
Addended by: Alysia Penna A on: 12/16/2021 10:22 AM   Modules accepted: Orders

## 2021-12-16 NOTE — Telephone Encounter (Signed)
I sent in a 30 day supply to [facility]

## 2022-01-26 ENCOUNTER — Telehealth: Payer: Self-pay | Admitting: Family Medicine

## 2022-01-26 NOTE — Telephone Encounter (Signed)
Last refill Oxycodone-12/27/21--180 tabs, 0 refills Last refill Diazepam-05/31/21--60 tabs, 5 refills  Last VV-10/25/21  No future OV scheduled.

## 2022-01-27 NOTE — Telephone Encounter (Signed)
I refilled the Diazepam, but no refills on Oxycodone without a PMV

## 2022-02-02 MED ORDER — OXYCODONE HCL 20 MG PO TABS: 20.0000 mg | ORAL_TABLET | ORAL | 0 refills | Status: DC | PRN

## 2022-02-02 NOTE — Telephone Encounter (Signed)
Called patient informed that Dr. Clent Ridges sent a 5 day supply of Oxycodone to [facility]

## 2022-02-02 NOTE — Telephone Encounter (Signed)
I sent in a 5 day supply  

## 2022-02-02 NOTE — Telephone Encounter (Signed)
Pt called to sched an appt to renew his oxyCODONE HCl 20 MG his appt is sched for Mon. 12/18 at 3pm and pt was wondering if he can get enough meds to get him through the weekend since he is completely out.   Please advise.

## 2022-02-06 ENCOUNTER — Telehealth (INDEPENDENT_AMBULATORY_CARE_PROVIDER_SITE_OTHER): Payer: Self-pay | Admitting: Family Medicine

## 2022-02-06 ENCOUNTER — Encounter: Payer: Self-pay | Admitting: Family Medicine

## 2022-02-06 DIAGNOSIS — G8929 Other chronic pain: Secondary | ICD-10-CM

## 2022-02-06 DIAGNOSIS — M5442 Lumbago with sciatica, left side: Secondary | ICD-10-CM

## 2022-02-06 DIAGNOSIS — F1190 Opioid use, unspecified, uncomplicated: Secondary | ICD-10-CM

## 2022-02-06 MED ORDER — OXYCODONE HCL 20 MG PO TABS: 20.0000 mg | ORAL_TABLET | ORAL | 0 refills | Status: DC | PRN

## 2022-02-06 MED ORDER — AMPHETAMINE-DEXTROAMPHETAMINE 30 MG PO TABS: 30.0000 mg | ORAL_TABLET | Freq: Three times a day (TID) | ORAL | 0 refills | Status: DC

## 2022-02-06 MED ORDER — OXYCODONE HCL 20 MG PO TABS: 20.0000 mg | ORAL_TABLET | ORAL | 0 refills | Status: AC | PRN

## 2022-02-06 NOTE — Progress Notes (Signed)
Subjective:    Patient ID: Steven Wilkerson, male    DOB: [birth year], 61 y.o.   MRN: [id number]  HPI Virtual Visit via Video Note  I connected with the patient on 02/06/22 at  3:00 PM EST by a video enabled telemedicine application and verified that I am speaking with the correct person using two identifiers.  Location patient: home Location provider:work or home office Persons participating in the virtual visit: patient, provider  I discussed the limitations of evaluation and management by telemedicine and the availability of in person appointments. The patient expressed understanding and agreed to proceed.   HPI: Here for pain management. He doing about the same with his pain levels. His new insurance that will begin next month will cover an MRI with no copay. Sp he plans to get the lumbar spine MRI done that we ordered earlier this year.    ROS: See pertinent positives and negatives per HPI.  History reviewed. No pertinent past medical history.  History reviewed. No pertinent surgical history.  History reviewed. No pertinent family history.   Current Outpatient Medications:    b complex vitamins tablet, Take 1 tablet by mouth daily., Disp: , Rfl:    busPIRone (BUSPAR) 10 MG tablet, Take 1 tablet (10 mg total) by mouth 3 (three) times daily., Disp: 90 tablet, Rfl: 1   diazepam (VALIUM) 10 MG tablet, TAKE ONE TABLET BY MOUTH EVERY 12 HOURS AS NEEDED FOR ANXIETY, Disp: 60 tablet, Rfl: 5   doxazosin (CARDURA) 4 MG tablet, Take 1 tablet (4 mg total) by mouth daily., Disp: 90 tablet, Rfl: 3   methocarbamol (ROBAXIN) 500 MG tablet, Take 1 tablet (500 mg total) by mouth every 6 (six) hours as needed for muscle spasms., Disp: 120 tablet, Rfl: 5   Multiple Vitamin (MULTIVITAMIN) tablet, Take 1 tablet by mouth daily., Disp: , Rfl:    tadalafil (CIALIS) 10 MG tablet, Take 1 tablet (10 mg total) by mouth daily as needed for erectile dysfunction., Disp: 90 tablet, Rfl: 1   [START ON  04/09/2022] amphetamine-dextroamphetamine (ADDERALL) 30 MG tablet, Take 1 tablet by mouth 3 (three) times daily., Disp: 90 tablet, Rfl: 0   [START ON 04/09/2022] Oxycodone HCl 20 MG TABS, Take 1 tablet (20 mg total) by mouth every 4 (four) hours as needed (pain)., Disp: 180 tablet, Rfl: 0  EXAM:  VITALS per patient if applicable:  GENERAL: alert, oriented, appears well and in no acute distress  HEENT: atraumatic, conjunttiva clear, no obvious abnormalities on inspection of external nose and ears  NECK: normal movements of the head and neck  LUNGS: on inspection no signs of respiratory distress, breathing rate appears normal, no obvious gross SOB, gasping or wheezing  CV: no obvious cyanosis  MS: moves all visible extremities without noticeable abnormality  PSYCH/NEURO: pleasant and cooperative, no obvious depression or anxiety, speech and thought processing grossly intact  ASSESSMENT AND PLAN: Pain management. Indication for chronic opioid: low back pain Medication and dose: Oxycodone 20 mg # pills per month: 180 Last UDS date: 05-31-21 Opioid Treatment Agreement signed (Y/N): 03-20-17 Opioid Treatment Agreement last reviewed with patient:  02-06-22 NCCSRS reviewed this encounter (include red flags): Yes Meds were refilled.  Gershon Crane, MD  Discussed the following assessment and plan:  No diagnosis found.     I discussed the assessment and treatment plan with the patient. The patient was provided an opportunity to ask questions and all were answered. The patient agreed with the plan and demonstrated an understanding  of the instructions.   The patient was advised to call back or seek an in-person evaluation if the symptoms worsen or if the condition fails to improve as anticipated.      Review of Systems     Objective:   Physical Exam        Assessment & Plan:

## 2022-05-19 ENCOUNTER — Other Ambulatory Visit: Payer: Self-pay | Admitting: Family Medicine

## 2022-05-22 NOTE — Telephone Encounter (Signed)
Last VV-02/06/22 Last refill-  not on current medication list  No future OV scheduled.

## 2022-06-06 ENCOUNTER — Telehealth (INDEPENDENT_AMBULATORY_CARE_PROVIDER_SITE_OTHER): Payer: 59 | Admitting: Family Medicine

## 2022-06-06 ENCOUNTER — Encounter: Payer: Self-pay | Admitting: Family Medicine

## 2022-06-06 DIAGNOSIS — G8929 Other chronic pain: Secondary | ICD-10-CM

## 2022-06-06 DIAGNOSIS — N521 Erectile dysfunction due to diseases classified elsewhere: Secondary | ICD-10-CM

## 2022-06-06 DIAGNOSIS — F1190 Opioid use, unspecified, uncomplicated: Secondary | ICD-10-CM

## 2022-06-06 DIAGNOSIS — M5442 Lumbago with sciatica, left side: Secondary | ICD-10-CM

## 2022-06-06 MED ORDER — OXYCODONE HCL 20 MG PO TABS: 20.0000 mg | ORAL_TABLET | ORAL | 0 refills | Status: DC | PRN

## 2022-06-06 MED ORDER — TADALAFIL 10 MG PO TABS: 10.0000 mg | ORAL_TABLET | Freq: Every day | ORAL | 1 refills | Status: DC | PRN

## 2022-06-06 MED ORDER — OXYCODONE HCL 20 MG PO TABS: 20.0000 mg | ORAL_TABLET | ORAL | 0 refills | Status: AC | PRN

## 2022-06-06 NOTE — Progress Notes (Signed)
Subjective:    Patient ID: Steven Wilkerson, male    DOB: 06-15-1960, 62 y.o.   MRN: [id number]  HPI Virtual Visit via Video Note  I connected with the patient on 06/06/22 at 10:00 AM EDT by a video enabled telemedicine application and verified that I am speaking with the correct person using two identifiers.  Location patient: home Location provider:work or home office Persons participating in the virtual visit: patient, provider  I discussed the limitations of evaluation and management by telemedicine and the availability of in person appointments. The patient expressed understanding and agreed to proceed.   HPI: Here for pain management. His back pain is about the same. The medication helps him get through the day.    ROS: See pertinent positives and negatives per HPI.  History reviewed. No pertinent past medical history.  History reviewed. No pertinent surgical history.  History reviewed. No pertinent family history.   Current Outpatient Medications:    b complex vitamins tablet, Take 1 tablet by mouth daily., Disp: , Rfl:    busPIRone (BUSPAR) 10 MG tablet, Take 1 tablet (10 mg total) by mouth 3 (three) times daily., Disp: 90 tablet, Rfl: 1   diazepam (VALIUM) 10 MG tablet, TAKE ONE TABLET BY MOUTH EVERY 12 HOURS AS NEEDED FOR ANXIETY, Disp: 60 tablet, Rfl: 5   doxazosin (CARDURA) 4 MG tablet, Take 1 tablet (4 mg total) by mouth daily., Disp: 90 tablet, Rfl: 3   methocarbamol (ROBAXIN) 500 MG tablet, Take 1 tablet (500 mg total) by mouth every 6 (six) hours as needed for muscle spasms., Disp: 120 tablet, Rfl: 5   Multiple Vitamin (MULTIVITAMIN) tablet, Take 1 tablet by mouth daily., Disp: , Rfl:    tadalafil (CIALIS) 10 MG tablet, Take 1 tablet (10 mg total) by mouth daily as needed for erectile dysfunction., Disp: 90 tablet, Rfl: 1   amphetamine-dextroamphetamine (ADDERALL) 30 MG tablet, Take 1 tablet by mouth 3 (three) times daily., Disp: 90 tablet, Rfl:  0  EXAM:  VITALS per patient if applicable:  GENERAL: alert, oriented, appears well and in no acute distress  HEENT: atraumatic, conjunttiva clear, no obvious abnormalities on inspection of external nose and ears  NECK: normal movements of the head and neck  LUNGS: on inspection no signs of respiratory distress, breathing rate appears normal, no obvious gross SOB, gasping or wheezing  CV: no obvious cyanosis  MS: moves all visible extremities without noticeable abnormality  PSYCH/NEURO: pleasant and cooperative, no obvious depression or anxiety, speech and thought processing grossly intact  ASSESSMENT AND PLAN: Pain management. Indication for chronic opioid: low back pain Medication and dose: Oxycodone 20 mg # pills per month: 180 Last UDS date: 05-31-21 Opioid Treatment Agreement signed (Y/N): 03-20-17 Opioid Treatment Agreement last reviewed with patient:  06-06-22 NCCSRS reviewed this encounter (include red flags): Yes Meds were refilled. He plans to get the lumbar spine MRI soon since his work schedule has eased up.  Gershon Crane, MD  Discussed the following assessment and plan:  No diagnosis found.     I discussed the assessment and treatment plan with the patient. The patient was provided an opportunity to ask questions and all were answered. The patient agreed with the plan and demonstrated an understanding of the instructions.   The patient was advised to call back or seek an in-person evaluation if the symptoms worsen or if the condition fails to improve as anticipated.      Review of Systems     Objective:  Physical Exam        Assessment & Plan:

## 2022-06-14 ENCOUNTER — Other Ambulatory Visit: Payer: Self-pay | Admitting: Family Medicine

## 2022-06-15 ENCOUNTER — Telehealth: Payer: Self-pay | Admitting: Family Medicine

## 2022-06-15 MED ORDER — AMPHETAMINE-DEXTROAMPHETAMINE 30 MG PO TABS: 1.0000 | ORAL_TABLET | Freq: Three times a day (TID) | ORAL | 0 refills | Status: DC

## 2022-06-15 NOTE — Telephone Encounter (Signed)
Done

## 2022-06-15 NOTE — Telephone Encounter (Signed)
Pt LOV was on 06/06/22 Last refill was done on 04/09/22 Please advise

## 2022-06-15 NOTE — Telephone Encounter (Signed)
Prescription Request  06/15/2022  LVV:  06/06/22  What is the name of the medication or equipment?   amphetamine-dextroamphetamine (ADDERALL) 30 MG tablet (Completely out)  diazepam (VALIUM) 10 MG tablet   methocarbamol (ROBAXIN) 500 MG tablet   tadalafil (CIALIS) 10 MG tablet  Have you contacted your pharmacy to request a refill?  Yes  Which pharmacy would you like this sent to?   [facility] [address] [place], [place] - [address]. AT [facility] [address] Phone: [phone]  Fax: [phone]       Patient notified that their request is being sent to the clinical staff for review and that they should receive a response within 2 business days.   Please advise at Mobile [phone] (mobile)    [facility] [address] [place], [place] - [address] [place]. AT [facility] [address] Phone: [phone]  Fax: [phone]

## 2022-06-16 ENCOUNTER — Other Ambulatory Visit: Payer: Self-pay

## 2022-06-16 MED ORDER — DIAZEPAM 10 MG PO TABS: 10.0000 mg | ORAL_TABLET | Freq: Two times a day (BID) | ORAL | 5 refills | Status: DC | PRN

## 2022-06-16 MED ORDER — METHOCARBAMOL 500 MG PO TABS: 500.0000 mg | ORAL_TABLET | Freq: Four times a day (QID) | ORAL | 5 refills | Status: DC | PRN

## 2022-06-16 NOTE — Telephone Encounter (Signed)
Done

## 2022-06-16 NOTE — Telephone Encounter (Signed)
Pt LOV was on 06/06/22 Refill for Valium was done on 01/27/22  Prescriptions for Adderal and Tadalafil  have already been sent to pt pharmacy. Please advise

## 2022-09-07 ENCOUNTER — Telehealth: Payer: Self-pay | Admitting: Family Medicine

## 2022-09-07 ENCOUNTER — Other Ambulatory Visit: Payer: Self-pay | Admitting: Family Medicine

## 2022-09-07 NOTE — Telephone Encounter (Signed)
Pt LOV was on 06/06/22 Last refill was done on 08/15/22 Please advise

## 2022-09-07 NOTE — Telephone Encounter (Signed)
He needs a PMV 

## 2022-09-07 NOTE — Telephone Encounter (Signed)
Prescription Request  09/07/2022  LOV: Visit date not found  What is the name of the medication or equipment? Oxycodone . Pt has an appt on 09-11-2022  Have you contacted your pharmacy to request a refill? No   Which pharmacy would you like this sent to?   [facility] [address] [place], [place] - [address]. AT [facility] [address] [place]. [place] [address] Phone: [phone] Fax: [phone]    Patient notified that their request is being sent to the clinical staff for review and that they should receive a response within 2 business days.   Please advise at Mobile [phone] (mobile)

## 2022-09-08 NOTE — Telephone Encounter (Signed)
He needs a PMV 

## 2022-09-08 NOTE — Telephone Encounter (Signed)
Pt has a PMV on 09/11/22

## 2022-09-11 ENCOUNTER — Encounter: Payer: Self-pay | Admitting: Family Medicine

## 2022-09-11 ENCOUNTER — Telehealth (INDEPENDENT_AMBULATORY_CARE_PROVIDER_SITE_OTHER): Payer: Commercial Managed Care - HMO | Admitting: Family Medicine

## 2022-09-11 ENCOUNTER — Other Ambulatory Visit: Payer: Self-pay

## 2022-09-11 ENCOUNTER — Ambulatory Visit: Payer: Commercial Managed Care - HMO | Admitting: Family Medicine

## 2022-09-11 DIAGNOSIS — F1190 Opioid use, unspecified, uncomplicated: Secondary | ICD-10-CM

## 2022-09-11 DIAGNOSIS — G8929 Other chronic pain: Secondary | ICD-10-CM

## 2022-09-11 DIAGNOSIS — M5442 Lumbago with sciatica, left side: Secondary | ICD-10-CM | POA: Diagnosis not present

## 2022-09-11 MED ORDER — ALPRAZOLAM 1 MG PO TABS
1.0000 mg | ORAL_TABLET | Freq: Two times a day (BID) | ORAL | 5 refills | Status: DC | PRN
  Filled 2022-09-11 – 2022-09-22 (×3): qty 60, 30d supply, fill #0
  Filled 2022-10-30 – 2023-02-22 (×4): qty 60, 30d supply, fill #1

## 2022-09-11 MED ORDER — AMPHETAMINE-DEXTROAMPHETAMINE 30 MG PO TABS
1.0000 | ORAL_TABLET | Freq: Three times a day (TID) | ORAL | 0 refills | Status: DC
  Filled 2022-09-11 – 2022-09-12 (×2): qty 90, 30d supply, fill #0

## 2022-09-11 MED ORDER — AMPHETAMINE-DEXTROAMPHETAMINE 30 MG PO TABS
1.0000 | ORAL_TABLET | Freq: Three times a day (TID) | ORAL | 0 refills | Status: DC
  Filled 2022-09-11: qty 90, 30d supply, fill #0

## 2022-09-11 MED ORDER — OXYCODONE HCL 20 MG PO TABS
20.0000 mg | ORAL_TABLET | ORAL | 0 refills | Status: DC | PRN
  Filled 2022-09-11 – 2022-09-12 (×2): qty 180, 30d supply, fill #0

## 2022-09-11 NOTE — Progress Notes (Signed)
Subjective:    Patient ID: Steven Wilkerson, male    DOB: Feb 26, 1960, 62 y.o.   MRN: [id number]  HPI Virtual Visit via Video Note  I connected with the patient on 09/11/22 at  4:00 PM EDT by a video enabled telemedicine application and verified that I am speaking with the correct person using two identifiers.  Location patient: home Location provider:work or home office Persons participating in the virtual visit: patient, provider  I discussed the limitations of evaluation and management by telemedicine and the availability of in person appointments. The patient expressed understanding and agreed to proceed.   HPI: Here for pain management. His back pain is about the same. He is changing his pharmacy to [employer] [facility] to get cheaper prices. He asks ot try Xanax instead of Valium, because this is not working as well as it used to.   ROS: See pertinent positives and negatives per HPI.  No past medical history on file.  No past surgical history on file.  No family history on file.   Current Outpatient Medications:    amphetamine-dextroamphetamine (ADDERALL) 30 MG tablet, Take 1 tablet by mouth 3 (three) times daily., Disp: 90 tablet, Rfl: 0   b complex vitamins tablet, Take 1 tablet by mouth daily., Disp: , Rfl:    busPIRone (BUSPAR) 10 MG tablet, Take 1 tablet (10 mg total) by mouth 3 (three) times daily., Disp: 90 tablet, Rfl: 1   diazepam (VALIUM) 10 MG tablet, Take 1 tablet (10 mg total) by mouth every 12 (twelve) hours as needed for anxiety., Disp: 60 tablet, Rfl: 5   doxazosin (CARDURA) 4 MG tablet, Take 1 tablet (4 mg total) by mouth daily., Disp: 90 tablet, Rfl: 3   methocarbamol (ROBAXIN) 500 MG tablet, Take 1 tablet (500 mg total) by mouth every 6 (six) hours as needed for muscle spasms., Disp: 120 tablet, Rfl: 5   Multiple Vitamin (MULTIVITAMIN) tablet, Take 1 tablet by mouth daily., Disp: , Rfl:    tadalafil (CIALIS) 10 MG tablet, Take  1 tablet (10 mg total) by mouth daily as needed for erectile dysfunction., Disp: 90 tablet, Rfl: 1  EXAM:  VITALS per patient if applicable:  GENERAL: alert, oriented, appears well and in no acute distress  HEENT: atraumatic, conjunttiva clear, no obvious abnormalities on inspection of external nose and ears  NECK: normal movements of the head and neck  LUNGS: on inspection no signs of respiratory distress, breathing rate appears normal, no obvious gross SOB, gasping or wheezing  CV: no obvious cyanosis  MS: moves all visible extremities without noticeable abnormality  PSYCH/NEURO: pleasant and cooperative, no obvious depression or anxiety, speech and thought processing grossly intact  ASSESSMENT AND PLAN: Pain management.  Indication for chronic opioid: low back pain Medication and dose: Oxycodone 20 mg # pills per month: 180 Last UDS date: 05-31-21 Opioid Treatment Agreement signed (Y/N): 03-20-17 Opioid Treatment Agreement last reviewed with patient:  09-11-22 NCCSRS reviewed this encounter (include red flags): Yes We refilled the Oxycodone for one month only because he is past due for a UDS. He agreed to come by our lab asap for this. He asked about taking Tylenol, and I suggested he take Tylenol 1000 mg BID along with the Oxycodone. For the naxiety, we will stop Valium and try Xanax 1 mg BID as needed.  Steven Crane, MD  Discussed the following assessment and plan:  No diagnosis found.     I discussed the assessment and  treatment plan with the patient. The patient was provided an opportunity to ask questions and all were answered. The patient agreed with the plan and demonstrated an understanding of the instructions.   The patient was advised to call back or seek an in-person evaluation if the symptoms worsen or if the condition fails to improve as anticipated.      Review of Systems     Objective:   Physical Exam        Assessment & Plan:

## 2022-09-12 ENCOUNTER — Other Ambulatory Visit: Payer: Self-pay

## 2022-09-12 ENCOUNTER — Other Ambulatory Visit (HOSPITAL_COMMUNITY): Payer: Self-pay

## 2022-09-12 MED ORDER — OXYCODONE HCL 20 MG PO TABS: 20.0000 mg | ORAL_TABLET | ORAL | 0 refills | Status: DC | PRN

## 2022-09-12 MED ORDER — AMPHETAMINE-DEXTROAMPHETAMINE 30 MG PO TABS: 30.0000 mg | ORAL_TABLET | Freq: Three times a day (TID) | ORAL | 0 refills | Status: DC

## 2022-09-12 NOTE — Telephone Encounter (Addendum)
Pt just had a VV on 09/11/22.  Pt called to say he made a mistake when he asked that all his refills be sent to the [facility].   Pt says he just found out he cannot use the [facility] and is asking if his refill requests can be resent to:  [id number] [place], [place] - [address] AT [facility] [address] [phone] [address] [place] [address]        ...as soon as possible

## 2022-09-12 NOTE — Telephone Encounter (Signed)
I sent in one RX each of Adderall and Oxycodone to the [facility]. Please call the [facility] pharmacy to cancel ALL the RX that I sent to them yesterday

## 2022-09-12 NOTE — Telephone Encounter (Signed)
Pt checking on progress of his previous request

## 2022-09-14 ENCOUNTER — Other Ambulatory Visit: Payer: Self-pay

## 2022-09-14 NOTE — Telephone Encounter (Signed)
Spoke with [facility] pharmacy cancelled the orders for Adderall and oxycodone

## 2022-09-22 ENCOUNTER — Other Ambulatory Visit (HOSPITAL_BASED_OUTPATIENT_CLINIC_OR_DEPARTMENT_OTHER): Payer: Self-pay

## 2022-09-22 ENCOUNTER — Telehealth: Payer: Self-pay | Admitting: Family Medicine

## 2022-09-22 ENCOUNTER — Other Ambulatory Visit: Payer: Self-pay

## 2022-09-22 NOTE — Telephone Encounter (Signed)
Pt said he did not pick up ALPRAZolam [name]) 1 MG tablet at  [facility] Phone: [phone]  Fax: [phone]     And would like medication to be sent to   [facility] #[id number] [place], [place] - [address] AT [facility] OF [facility] [address] Phone: [phone]  Fax: [phone]

## 2022-09-26 ENCOUNTER — Other Ambulatory Visit: Payer: Self-pay

## 2022-09-26 NOTE — Telephone Encounter (Signed)
Spoke to pharmacist and refill was picked up 09/22/22.

## 2022-10-11 ENCOUNTER — Other Ambulatory Visit: Payer: Medicaid Other

## 2022-10-11 DIAGNOSIS — F1190 Opioid use, unspecified, uncomplicated: Secondary | ICD-10-CM

## 2022-10-12 ENCOUNTER — Telehealth: Payer: Self-pay | Admitting: Family Medicine

## 2022-10-12 NOTE — Telephone Encounter (Signed)
Prescription Request  10/12/2022  LOV: Visit date not found  What is the name of the medication or equipment?  Oxycodone HCl 20 MG TABS ,amphetamine-dextroamphetamine (ADDERALL) 30 MG tablet , ALPRAZolam (XANAX) 1 MG tablet    Have you contacted your pharmacy to request a refill? No  [id number] - [place], [place] - [address] AT [facility] OF [facility] & [address] Phone:   Fax: [phone]       Which pharmacy would you like this sent to?     Patient notified that their request is being sent to the clinical staff for review and that they should receive a response within 2 business days.   Please advise at Mobile [phone] (mobile)

## 2022-10-13 LAB — DRUG MONITOR, PANEL 1, W/CONF, URINE
Amphetamines: NEGATIVE ng/mL (ref <500)
Barbiturates: NEGATIVE ng/mL (ref <300)
Benzodiazepines: NEGATIVE ng/mL (ref <100)
Cocaine Metabolite: NEGATIVE ng/mL (ref <150)
Codeine: NEGATIVE ng/mL (ref <50)
Creatinine: 38.8 mg/dL (ref >=20.0)
Hydrocodone: NEGATIVE ng/mL (ref <50)
Hydromorphone: NEGATIVE ng/mL (ref <50)
Marijuana Metabolite: NEGATIVE ng/mL (ref <20)
Methadone Metabolite: NEGATIVE ng/mL (ref <100)
Morphine: NEGATIVE ng/mL (ref <50)
Norhydrocodone: NEGATIVE ng/mL (ref <50)
Noroxycodone: NEGATIVE ng/mL (ref <50)
Opiates: NEGATIVE ng/mL (ref <100)
Oxidant: NEGATIVE ug/mL (ref <200)
Oxycodone: 4156 ng/mL — ABNORMAL HIGH (ref <50)
Oxycodone: POSITIVE ng/mL — AB (ref <100)
Oxymorphone: NEGATIVE ng/mL (ref <50)
Phencyclidine: NEGATIVE ng/mL (ref <25)
pH: 7.3 (ref 4.5–9.0)

## 2022-10-13 LAB — DM TEMPLATE

## 2022-10-13 NOTE — Telephone Encounter (Signed)
 Pt is calling back and stated he is out of his medication.

## 2022-10-13 NOTE — Telephone Encounter (Signed)
Attempted to call pt regarding this request mailbox is full, will try again later
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
## 2022-10-17 ENCOUNTER — Telehealth: Payer: Self-pay | Admitting: Family Medicine

## 2022-10-17 MED ORDER — OXYCODONE HCL 20 MG PO TABS: 20.0000 mg | ORAL_TABLET | ORAL | 0 refills | Status: DC | PRN

## 2022-10-17 NOTE — Telephone Encounter (Signed)
Pt called to remind MD that he was told all he had to do was a urine test, which he did on 10/11/22, so that he can have his refills.  Pt states he is completely out of his meds, and would like to know what else can he do to expedite the refills?  Please advise.

## 2022-10-17 NOTE — Telephone Encounter (Signed)
Patient called inquiring about his prescriptions from 10/16/2022, pt informed of where the prescriptions can be picked up at.  Pt requested they go to [facility] on [address], pt will be picking up his prescriptions at [facility] and will call here to have all the pharmacies except [facility] removed from his account.

## 2022-10-17 NOTE — Telephone Encounter (Signed)
I sent in refills for 2 more months

## 2022-10-20 NOTE — Telephone Encounter (Signed)
Rx sent to pt pharmacy 

## 2022-10-30 ENCOUNTER — Other Ambulatory Visit: Payer: Self-pay

## 2022-10-31 ENCOUNTER — Other Ambulatory Visit: Payer: Self-pay

## 2022-11-07 ENCOUNTER — Other Ambulatory Visit: Payer: Self-pay

## 2022-12-13 ENCOUNTER — Other Ambulatory Visit: Payer: Self-pay

## 2022-12-13 ENCOUNTER — Other Ambulatory Visit: Payer: Self-pay | Admitting: Family Medicine

## 2022-12-13 NOTE — Telephone Encounter (Signed)
Prescription Request  12/13/2022  LOV: Visit date not found  What is the name of the medication or equipment?  Oxycodone HCl 20 MG TABS ,amphetamine-dextroamphetamine (ADDERALL) 30 MG tablet , ALPRAZolam (XANAX) 1 MG tablet   Have you contacted your pharmacy to request a refill? Yes   Which pharmacy would you like this sent to?  [facility] [address] [place], [place] - [address] AT [facility] [address] & [address] Phone: [phone]  Fax: [phone]      Patient notified that their request is being sent to the clinical staff for review and that they should receive a response within 2 business days.   Please advise at Mobile [phone] (mobile)

## 2022-12-14 MED ORDER — AMPHETAMINE-DEXTROAMPHETAMINE 30 MG PO TABS: 30.0000 mg | ORAL_TABLET | Freq: Three times a day (TID) | ORAL | 0 refills | Status: DC

## 2022-12-14 NOTE — Telephone Encounter (Signed)
Pt is aware md out of office this afternoon will be back tomorrow

## 2022-12-14 NOTE — Telephone Encounter (Signed)
I refilled the Adderall. He already has refills on Xanax until January

## 2022-12-14 NOTE — Telephone Encounter (Signed)
Pt checking on progress of refill request for Oxycodone HCl 20 MG TABS . Says the other meds were filled but that one was not    [facility] [address] [place], [place] - [address] [place]. AT [facility] [address] & [address] Phone: [phone]  Fax: [phone]

## 2022-12-15 NOTE — Telephone Encounter (Signed)
Pt would like a callback once med has been sent to pharm

## 2022-12-18 NOTE — Telephone Encounter (Signed)
Spoke with pt scheduled appointment for 12/19/22 for medication refill per Dr Clent Ridges

## 2022-12-18 NOTE — Telephone Encounter (Signed)
Pt is calling checking on oxycodone refill

## 2022-12-19 ENCOUNTER — Encounter: Payer: Self-pay | Admitting: Family Medicine

## 2022-12-19 ENCOUNTER — Telehealth (INDEPENDENT_AMBULATORY_CARE_PROVIDER_SITE_OTHER): Payer: Commercial Managed Care - HMO | Admitting: Family Medicine

## 2022-12-19 DIAGNOSIS — G8929 Other chronic pain: Secondary | ICD-10-CM

## 2022-12-19 DIAGNOSIS — M5442 Lumbago with sciatica, left side: Secondary | ICD-10-CM | POA: Diagnosis not present

## 2022-12-19 DIAGNOSIS — F1190 Opioid use, unspecified, uncomplicated: Secondary | ICD-10-CM | POA: Diagnosis not present

## 2022-12-19 MED ORDER — OXYCODONE HCL 20 MG PO TABS: 20.0000 mg | ORAL_TABLET | ORAL | 0 refills | Status: DC | PRN

## 2022-12-19 NOTE — Progress Notes (Signed)
Subjective:    Patient ID: Steven Wilkerson, male    DOB: 1960/09/25, 62 y.o.   MRN: [id number]  HPI Virtual Visit via Video Note  I connected with the patient on 12/19/22 at  3:15 PM EDT by a video enabled telemedicine application and verified that I am speaking with the correct person using two identifiers.  Location patient: home Location provider:work or home office Persons participating in the virtual visit: patient, provider  I discussed the limitations of evaluation and management by telemedicine and the availability of in person appointments. The patient expressed understanding and agreed to proceed.   HPI: Here for pain management. He is doing about the same.    ROS: See pertinent positives and negatives per HPI.  History reviewed. No pertinent past medical history.  History reviewed. No pertinent surgical history.  History reviewed. No pertinent family history.   Current Outpatient Medications:    ALPRAZolam (XANAX) 1 MG tablet, Take 1 tablet (1 mg total) by mouth 2 (two) times daily as needed for anxiety., Disp: 60 tablet, Rfl: 5   amphetamine-dextroamphetamine (ADDERALL) 30 MG tablet, Take 1 tablet by mouth 3 (three) times daily., Disp: 90 tablet, Rfl: 0   amphetamine-dextroamphetamine (ADDERALL) 30 MG tablet, Take 1 tablet by mouth 3 (three) times daily., Disp: 90 tablet, Rfl: 0   amphetamine-dextroamphetamine (ADDERALL) 30 MG tablet, Take 1 tablet by mouth 3 (three) times daily., Disp: 90 tablet, Rfl: 0   b complex vitamins tablet, Take 1 tablet by mouth daily., Disp: , Rfl:    busPIRone (BUSPAR) 10 MG tablet, Take 1 tablet (10 mg total) by mouth 3 (three) times daily., Disp: 90 tablet, Rfl: 1   doxazosin (CARDURA) 4 MG tablet, Take 1 tablet (4 mg total) by mouth daily., Disp: 90 tablet, Rfl: 3   methocarbamol (ROBAXIN) 500 MG tablet, Take 1 tablet (500 mg total) by mouth every 6 (six) hours as needed for muscle spasms., Disp: 120 tablet, Rfl: 5   Multiple Vitamin  (MULTIVITAMIN) tablet, Take 1 tablet by mouth daily., Disp: , Rfl:    tadalafil (CIALIS) 10 MG tablet, Take 1 tablet (10 mg total) by mouth daily as needed for erectile dysfunction., Disp: 90 tablet, Rfl: 1   Oxycodone HCl 20 MG TABS, Take 1 tablet (20 mg total) by mouth every 4 (four) hours as needed (pain)., Disp: 180 tablet, Rfl: 0   Oxycodone HCl 20 MG TABS, Take 1 tablet (20 mg total) by mouth every 4 (four) hours as needed (pain)., Disp: 180 tablet, Rfl: 0   Oxycodone HCl 20 MG TABS, Take 1 tablet (20 mg total) by mouth every 4 (four) hours as needed (pain)., Disp: 180 tablet, Rfl: 0  EXAM:  VITALS per patient if applicable:  GENERAL: alert, oriented, appears well and in no acute distress  HEENT: atraumatic, conjunttiva clear, no obvious abnormalities on inspection of external nose and ears  NECK: normal movements of the head and neck  LUNGS: on inspection no signs of respiratory distress, breathing rate appears normal, no obvious gross SOB, gasping or wheezing  CV: no obvious cyanosis  MS: moves all visible extremities without noticeable abnormality  PSYCH/NEURO: pleasant and cooperative, no obvious depression or anxiety, speech and thought processing grossly intact  ASSESSMENT AND PLAN: Pain management. Indication for chronic opioid: low back pain Medication and dose: Oxycodone 20 mg # pills per month: 180 Last UDS date: 10-11-22 Opioid Treatment Agreement signed (Y/N): 03-20-17 Opioid Treatment Agreement last reviewed with patient:  12-19-22 NCCSRS reviewed this  encounter (include red flags):  Yes Meds were refilled.  Gershon Crane, MD  Discussed the following assessment and plan:  No diagnosis found.     I discussed the assessment and treatment plan with the patient. The patient was provided an opportunity to ask questions and all were answered. The patient agreed with the plan and demonstrated an understanding of the instructions.   The patient was advised to call  back or seek an in-person evaluation if the symptoms worsen or if the condition fails to improve as anticipated.      Review of Systems     Objective:   Physical Exam        Assessment & Plan:

## 2022-12-25 ENCOUNTER — Other Ambulatory Visit: Payer: Self-pay

## 2023-01-26 ENCOUNTER — Other Ambulatory Visit: Payer: Self-pay

## 2023-01-30 ENCOUNTER — Telehealth: Payer: Self-pay | Admitting: Family Medicine

## 2023-01-30 NOTE — Telephone Encounter (Signed)
Pt is going out of town on 02-07-2023 and would like early refills on amphetamine-dextroamphetamine (ADDERALL) 30 MG tablet , Oxycodone HCl 20 MG TABS  [facility] [address] [place], [place] - [address] AT [facility] [address] & [address] Phone: [phone]  Fax: [phone]

## 2023-01-31 NOTE — Telephone Encounter (Signed)
Attempted to call pt back message call cannot be completed at this time. Will try again later

## 2023-01-31 NOTE — Telephone Encounter (Signed)
NO early refills. He will need to ration these out until his scheduled refills are available

## 2023-02-02 NOTE — Telephone Encounter (Signed)
Attempted the 2nd time to call pt regarding this request but message call cannot be completed. Sent a MyChart message to pt regarding Dr Clent Ridges advise

## 2023-02-06 ENCOUNTER — Other Ambulatory Visit: Payer: Self-pay

## 2023-02-22 ENCOUNTER — Other Ambulatory Visit: Payer: Self-pay

## 2023-03-20 ENCOUNTER — Ambulatory Visit: Payer: Commercial Managed Care - HMO | Admitting: Family Medicine

## 2023-03-20 ENCOUNTER — Other Ambulatory Visit (HOSPITAL_COMMUNITY): Payer: Self-pay

## 2023-03-20 ENCOUNTER — Encounter: Payer: Self-pay | Admitting: Family Medicine

## 2023-03-20 ENCOUNTER — Telehealth (INDEPENDENT_AMBULATORY_CARE_PROVIDER_SITE_OTHER): Payer: Commercial Managed Care - HMO | Admitting: Family Medicine

## 2023-03-20 ENCOUNTER — Telehealth: Payer: Self-pay | Admitting: Pharmacy Technician

## 2023-03-20 DIAGNOSIS — M5442 Lumbago with sciatica, left side: Secondary | ICD-10-CM | POA: Diagnosis not present

## 2023-03-20 DIAGNOSIS — M25511 Pain in right shoulder: Secondary | ICD-10-CM | POA: Diagnosis not present

## 2023-03-20 DIAGNOSIS — M25512 Pain in left shoulder: Secondary | ICD-10-CM | POA: Diagnosis not present

## 2023-03-20 DIAGNOSIS — F1190 Opioid use, unspecified, uncomplicated: Secondary | ICD-10-CM | POA: Diagnosis not present

## 2023-03-20 DIAGNOSIS — G8929 Other chronic pain: Secondary | ICD-10-CM

## 2023-03-20 MED ORDER — OXYCODONE HCL 20 MG PO TABS: 20.0000 mg | ORAL_TABLET | ORAL | 0 refills | Status: DC | PRN

## 2023-03-20 MED ORDER — AMPHETAMINE-DEXTROAMPHETAMINE 30 MG PO TABS: 30.0000 mg | ORAL_TABLET | Freq: Three times a day (TID) | ORAL | 0 refills | Status: DC

## 2023-03-20 MED ORDER — METHOCARBAMOL 500 MG PO TABS: 500.0000 mg | ORAL_TABLET | Freq: Four times a day (QID) | ORAL | 5 refills | Status: DC | PRN

## 2023-03-20 MED ORDER — ALPRAZOLAM 1 MG PO TABS: 1.0000 mg | ORAL_TABLET | Freq: Two times a day (BID) | ORAL | 5 refills | Status: DC | PRN

## 2023-03-20 NOTE — Telephone Encounter (Signed)
Pharmacy Patient Advocate Encounter   Received notification from CoverMyMeds that prior authorization for oxyCODONE HCl 20MG  tablets is required/requested.   Insurance verification completed.   The patient is insured through [employer] .   Per test claim: PA required; PA submitted to above mentioned insurance via CoverMyMeds Key/confirmation #/EOC [facility] Status is pending

## 2023-03-20 NOTE — Progress Notes (Signed)
Subjective:    Patient ID: Steven Wilkerson, male    DOB: 12-Nov-1960, 63 y.o.   MRN: [id number]  HPI Virtual Visit via Video Note  I connected with the patient on 03/20/23 at  2:30 PM EST by a video enabled telemedicine application and verified that I am speaking with the correct person using two identifiers.  Location patient: home Location provider:work or home office Persons participating in the virtual visit: patient, provider  I discussed the limitations of evaluation and management by telemedicine and the availability of in person appointments. The patient expressed understanding and agreed to proceed.   HPI:    ROS: See pertinent positives and negatives per HPI.  History reviewed. No pertinent past medical history.  History reviewed. No pertinent surgical history.  History reviewed. No pertinent family history.   Current Outpatient Medications:    ALPRAZolam (XANAX) 1 MG tablet, Take 1 tablet (1 mg total) by mouth 2 (two) times daily as needed for anxiety., Disp: 60 tablet, Rfl: 5   amphetamine-dextroamphetamine (ADDERALL) 30 MG tablet, Take 1 tablet by mouth 3 (three) times daily., Disp: 90 tablet, Rfl: 0   amphetamine-dextroamphetamine (ADDERALL) 30 MG tablet, Take 1 tablet by mouth 3 (three) times daily., Disp: 90 tablet, Rfl: 0   amphetamine-dextroamphetamine (ADDERALL) 30 MG tablet, Take 1 tablet by mouth 3 (three) times daily., Disp: 90 tablet, Rfl: 0   b complex vitamins tablet, Take 1 tablet by mouth daily., Disp: , Rfl:    busPIRone (BUSPAR) 10 MG tablet, Take 1 tablet (10 mg total) by mouth 3 (three) times daily., Disp: 90 tablet, Rfl: 1   doxazosin (CARDURA) 4 MG tablet, Take 1 tablet (4 mg total) by mouth daily., Disp: 90 tablet, Rfl: 3   methocarbamol (ROBAXIN) 500 MG tablet, Take 1 tablet (500 mg total) by mouth every 6 (six) hours as needed for muscle spasms., Disp: 120 tablet, Rfl: 5   Multiple Vitamin (MULTIVITAMIN) tablet, Take 1 tablet by mouth daily.,  Disp: , Rfl:    Oxycodone HCl 20 MG TABS, Take 1 tablet (20 mg total) by mouth every 4 (four) hours as needed (pain)., Disp: 180 tablet, Rfl: 0   Oxycodone HCl 20 MG TABS, Take 1 tablet (20 mg total) by mouth every 4 (four) hours as needed (pain)., Disp: 180 tablet, Rfl: 0   Oxycodone HCl 20 MG TABS, Take 1 tablet (20 mg total) by mouth every 4 (four) hours as needed (pain)., Disp: 180 tablet, Rfl: 0   tadalafil (CIALIS) 10 MG tablet, Take 1 tablet (10 mg total) by mouth daily as needed for erectile dysfunction., Disp: 90 tablet, Rfl: 1  EXAM:  VITALS per patient if applicable:  GENERAL: alert, oriented, appears well and in no acute distress  HEENT: atraumatic, conjunttiva clear, no obvious abnormalities on inspection of external nose and ears  NECK: normal movements of the head and neck  LUNGS: on inspection no signs of respiratory distress, breathing rate appears normal, no obvious gross SOB, gasping or wheezing  CV: no obvious cyanosis  MS: moves all visible extremities without noticeable abnormality  PSYCH/NEURO: pleasant and cooperative, no obvious depression or anxiety, speech and thought processing grossly intact  ASSESSMENT AND PLAN:  Discussed the following assessment and plan:  No diagnosis found.     I discussed the assessment and treatment plan with the patient. The patient was provided an opportunity to ask questions and all were answered. The patient agreed with the plan and demonstrated an understanding of the instructions.  The patient was advised to call back or seek an in-person evaluation if the symptoms worsen or if the condition fails to improve as anticipated.   HPI:   Here for pain management. His back pain is about the same. He also complains of worsening pain in both shoulders.    Review of Systems  Constitutional: Negative.   Musculoskeletal:  Positive for back pain.       Objective:   Physical Exam        Assessment & Plan:  Pain  management.  Indication for chronic opioid: low back pain Medication and dose: Oxycodone 20 mg # pills per month: 180 Last UDS date: 10-11-22 Opioid Treatment Agreement signed (Y/N): 03-20-17 Opioid Treatment Agreement last reviewed with patient:  03-20-23 NCCSRS reviewed this encounter (include red flags): Yes Meds were refilled. We will also refer him to Sports Medicine for bilateral shoulder pain. Gershon Crane, MD

## 2023-03-21 ENCOUNTER — Other Ambulatory Visit (HOSPITAL_COMMUNITY): Payer: Self-pay

## 2023-03-21 NOTE — Telephone Encounter (Signed)
Pharmacy Patient Advocate Encounter  Received notification from [employer] that Prior Authorization for  oxyCODONE HCl 20MG  tablets  has been APPROVED from 03/20/2023 to 03/18/2024. Ran test claim, Copay is $20.83. This test claim was processed through [facility]- copay amounts may vary at other pharmacies due to pharmacy/plan contracts, or as the patient moves through the different stages of their insurance plan.   PA #/Case ID/Reference #: [id number]

## 2023-03-22 ENCOUNTER — Other Ambulatory Visit (HOSPITAL_COMMUNITY): Payer: Self-pay

## 2023-03-22 ENCOUNTER — Telehealth: Payer: Self-pay

## 2023-03-22 NOTE — Telephone Encounter (Signed)
Pharmacy Patient Advocate Encounter  Received notification from [employer] that Prior Authorization for Oxycodone 20mg  has been APPROVED from 03/20/2023 to 03/20/2024   PA #/Case ID/Reference #: [id number]

## 2023-03-22 NOTE — Telephone Encounter (Signed)
Pharmacy Patient Advocate Encounter   Received notification from  [facility] Portal that prior authorization for Oxycodone 20 mg tablets is required/requested.   Insurance verification completed.   The patient is insured through [facility] .   Per test claim: PA required; PA submitted to above mentioned insurance via Phone Key/confirmation #/EOC [id number] Status is pending  *faxed chart notes to [facility] at [phone]

## 2023-03-27 ENCOUNTER — Ambulatory Visit: Payer: Commercial Managed Care - HMO | Admitting: Family Medicine

## 2023-03-27 NOTE — Progress Notes (Deleted)
   LILLETTE Ileana Collet, PhD, LAT, ATC acting as a scribe for Artist Lloyd, MD.  Steven Wilkerson is a 63 y.o. male who presents to [facility] at [facility] today for bilat shoulder pain x ***. Pt locates pain to ***  Radiates: Aggravates: Treatments tried:  Pertinent review of systems: ***  Relevant historical information: ***   Exam:  There were no vitals taken for this visit. General: Well Developed, well nourished, and in no acute distress.   MSK: ***    Lab and Radiology Results No results found for this or any previous visit (from the past 72 hours). No results found.     Assessment and Plan: 63 y.o. male with ***   PDMP not reviewed this encounter. No orders of the defined types were placed in this encounter.  No orders of the defined types were placed in this encounter.    Discussed warning signs or symptoms. Please see discharge instructions. Patient expresses understanding.   ***

## 2023-03-30 ENCOUNTER — Other Ambulatory Visit (HOSPITAL_COMMUNITY): Payer: Self-pay

## 2023-05-31 ENCOUNTER — Telehealth: Payer: Self-pay | Admitting: Family Medicine

## 2023-05-31 NOTE — Telephone Encounter (Signed)
 Copied from CRM [id number]. Topic: Referral - Question >> May 31, 2023  3:40 PM Steven Wilkerson wrote: Reason for CRM: Patient is requesting information regarding his MRI referral for his shoulder. Callback # [phone]

## 2023-06-01 NOTE — Telephone Encounter (Signed)
 I do not order shoulder MRI's myself. Instead I refer to either Sports Medicine or Orthopedics for an evaluation, then they can get an MRI if needed. I did a referral on 03-20-23 to Sports Medicine for his shoulder pain. What happened with that?

## 2023-06-01 NOTE — Telephone Encounter (Signed)
 Spoke with pt state that he did not mean the MRI but he wanted to know which office the sports medicine was sent, provided pt with the office number to [facility], advised to call them for scheduling

## 2023-06-05 ENCOUNTER — Telehealth (INDEPENDENT_AMBULATORY_CARE_PROVIDER_SITE_OTHER): Admitting: Family Medicine

## 2023-06-05 ENCOUNTER — Encounter: Payer: Self-pay | Admitting: Family Medicine

## 2023-06-05 DIAGNOSIS — F1190 Opioid use, unspecified, uncomplicated: Secondary | ICD-10-CM | POA: Diagnosis not present

## 2023-06-05 DIAGNOSIS — M5442 Lumbago with sciatica, left side: Secondary | ICD-10-CM | POA: Diagnosis not present

## 2023-06-05 DIAGNOSIS — G8929 Other chronic pain: Secondary | ICD-10-CM | POA: Diagnosis not present

## 2023-06-05 MED ORDER — OXYCODONE HCL 20 MG PO TABS: 20.0000 mg | ORAL_TABLET | ORAL | 0 refills | Status: DC | PRN

## 2023-06-05 MED ORDER — AMPHETAMINE-DEXTROAMPHETAMINE 30 MG PO TABS: 30.0000 mg | ORAL_TABLET | Freq: Three times a day (TID) | ORAL | 0 refills | Status: DC

## 2023-06-05 NOTE — Progress Notes (Signed)
 Subjective:    Patient ID: Steven Wilkerson, male    DOB: 03/31/1960, 63 y.o.   MRN: [id number]  HPI Virtual Visit via Video Note  I connected with the patient on 06/05/23 at  4:00 PM EDT by a video enabled telemedicine application and verified that I am speaking with the correct person using two identifiers.  Location patient: home Location provider:work or home office Persons participating in the virtual visit: patient, provider  I discussed the limitations of evaluation and management by telemedicine and the availability of in person appointments. The patient expressed understanding and agreed to proceed.   HPI: Here for pain management. He has been doing well.    ROS: See pertinent positives and negatives per HPI.  History reviewed. No pertinent past medical history.  History reviewed. No pertinent surgical history.  History reviewed. No pertinent family history.   Current Outpatient Medications:    ALPRAZolam (XANAX) 1 MG tablet, Take 1 tablet (1 mg total) by mouth 2 (two) times daily as needed for anxiety., Disp: 60 tablet, Rfl: 5   amphetamine-dextroamphetamine (ADDERALL) 30 MG tablet, Take 1 tablet by mouth 3 (three) times daily., Disp: 90 tablet, Rfl: 0   amphetamine-dextroamphetamine (ADDERALL) 30 MG tablet, Take 1 tablet by mouth 3 (three) times daily., Disp: 90 tablet, Rfl: 0   amphetamine-dextroamphetamine (ADDERALL) 30 MG tablet, Take 1 tablet by mouth 3 (three) times daily., Disp: 90 tablet, Rfl: 0   b complex vitamins tablet, Take 1 tablet by mouth daily., Disp: , Rfl:    busPIRone (BUSPAR) 10 MG tablet, Take 1 tablet (10 mg total) by mouth 3 (three) times daily., Disp: 90 tablet, Rfl: 1   doxazosin (CARDURA) 4 MG tablet, Take 1 tablet (4 mg total) by mouth daily., Disp: 90 tablet, Rfl: 3   methocarbamol (ROBAXIN) 500 MG tablet, Take 1 tablet (500 mg total) by mouth every 6 (six) hours as needed for muscle spasms., Disp: 120 tablet, Rfl: 5   Multiple Vitamin  (MULTIVITAMIN) tablet, Take 1 tablet by mouth daily., Disp: , Rfl:    Oxycodone HCl 20 MG TABS, Take 1 tablet (20 mg total) by mouth every 4 (four) hours as needed (pain)., Disp: 180 tablet, Rfl: 0   Oxycodone HCl 20 MG TABS, Take 1 tablet (20 mg total) by mouth every 4 (four) hours as needed (pain)., Disp: 180 tablet, Rfl: 0   Oxycodone HCl 20 MG TABS, Take 1 tablet (20 mg total) by mouth every 4 (four) hours as needed (pain)., Disp: 180 tablet, Rfl: 0   tadalafil (CIALIS) 10 MG tablet, Take 1 tablet (10 mg total) by mouth daily as needed for erectile dysfunction., Disp: 90 tablet, Rfl: 1  EXAM:  VITALS per patient if applicable:  GENERAL: alert, oriented, appears well and in no acute distress  HEENT: atraumatic, conjunttiva clear, no obvious abnormalities on inspection of external nose and ears  NECK: normal movements of the head and neck  LUNGS: on inspection no signs of respiratory distress, breathing rate appears normal, no obvious gross SOB, gasping or wheezing  CV: no obvious cyanosis  MS: moves all visible extremities without noticeable abnormality  PSYCH/NEURO: pleasant and cooperative, no obvious depression or anxiety, speech and thought processing grossly intact  ASSESSMENT AND PLAN: Pain management. Indication for chronic opioid: low back pain Medication and dose: Oxycodone 20 mg # pills per month: 180 Last UDS date: 10-11-22 Opioid Treatment Agreement signed (Y/N): 03-20-17 Opioid Treatment Agreement last reviewed with patient:  06-05-23 NCCSRS reviewed this encounter (  include red flags): Yes Meds were refilled.  Corita Diego, MD  Discussed the following assessment and plan:  No diagnosis found.     I discussed the assessment and treatment plan with the patient. The patient was provided an opportunity to ask questions and all were answered. The patient agreed with the plan and demonstrated an understanding of the instructions.   The patient was advised to call  back or seek an in-person evaluation if the symptoms worsen or if the condition fails to improve as anticipated.      Review of Systems     Objective:   Physical Exam        Assessment & Plan:

## 2023-06-05 NOTE — Telephone Encounter (Unsigned)
 Copied from CRM [id number]. Topic: Appointments - Appointment Info/Confirmation >> Jun 05, 2023  2:05 PM Shereese L wrote: Patient/patient representative is calling for information regarding an appointment.  Patient called to confirm the appt and stated that he needs to change the phone# to send the link [phone]

## 2023-06-12 ENCOUNTER — Ambulatory Visit: Admitting: Nurse Practitioner

## 2023-06-12 ENCOUNTER — Encounter: Payer: Self-pay | Admitting: Nurse Practitioner

## 2023-06-12 VITALS — BP 145/86 | HR 89 | Temp 97.7°F | Ht 67.0 in | Wt 128.4 lb

## 2023-06-12 DIAGNOSIS — Z139 Encounter for screening, unspecified: Secondary | ICD-10-CM
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
## 2023-06-12 NOTE — Progress Notes (Signed)
 Office Visit  Subjective   Patient ID: Steven Wilkerson   DOB: 09/03/1960   Age: 63 y.o.   MRN: [id number]   Chief Complaint Chief Complaint  Patient presents with   Blood Pressure Check    Pt states he had a high blood pressure reading at a local church event last week. No history of hypertension in the past. He lost Medicare care and unable to schedule an appointment with his doctor.      History of Present Illness Hypertension This is a new problem. The current episode started more than 1 month ago. The problem has been gradually worsening since onset. The problem is uncontrolled. Associated symptoms include anxiety and headaches. Pertinent negatives include no chest pain, malaise/fatigue, neck pain, orthopnea, palpitations, peripheral edema or shortness of breath. There are no associated agents to hypertension. Risk factors for coronary artery disease include family history. Past treatments include nothing. Compliance problems include medication cost.      Past Medical History No past medical history on file.   Allergies Allergies  Allergen Reactions   Doxazosin  Other (See Comments)    Leg weakness   Sulfa  Drugs Cross Reactors Rash   Penicillins Itching     Medications  Current Outpatient Medications:    ALPRAZolam  (XANAX ) 1 MG tablet, Take 1 tablet (1 mg total) by mouth 2 (two) times daily as needed for anxiety., Disp: 60 tablet, Rfl: 5   amphetamine -dextroamphetamine  (ADDERALL) 30 MG tablet, Take 1 tablet by mouth 3 (three) times daily., Disp: 90 tablet, Rfl: 0   amphetamine -dextroamphetamine  (ADDERALL) 30 MG tablet, Take 1 tablet by mouth 3 (three) times daily., Disp: 90 tablet, Rfl: 0   amphetamine -dextroamphetamine  (ADDERALL) 30 MG tablet, Take 1 tablet by mouth 3 (three) times daily., Disp: 90 tablet, Rfl: 0   b complex vitamins tablet, Take 1 tablet by mouth daily. (Patient not taking: Reported on 06/12/2023), Disp: , Rfl:    busPIRone  (BUSPAR ) 10 MG tablet, Take 1 tablet  (10 mg total) by mouth 3 (three) times daily. (Patient not taking: Reported on 06/12/2023), Disp: 90 tablet, Rfl: 1   doxazosin  (CARDURA ) 4 MG tablet, Take 1 tablet (4 mg total) by mouth daily., Disp: 90 tablet, Rfl: 3   methocarbamol  (ROBAXIN ) 500 MG tablet, Take 1 tablet (500 mg total) by mouth every 6 (six) hours as needed for muscle spasms., Disp: 120 tablet, Rfl: 5   Multiple Vitamin (MULTIVITAMIN) tablet, Take 1 tablet by mouth daily., Disp: , Rfl:    Oxycodone  HCl 20 MG TABS, Take 1 tablet (20 mg total) by mouth every 4 (four) hours as needed (pain)., Disp: 180 tablet, Rfl: 0   Oxycodone  HCl 20 MG TABS, Take 1 tablet (20 mg total) by mouth every 4 (four) hours as needed (pain)., Disp: 180 tablet, Rfl: 0   Oxycodone  HCl 20 MG TABS, Take 1 tablet (20 mg total) by mouth every 4 (four) hours as needed (pain)., Disp: 180 tablet, Rfl: 0   tadalafil  (CIALIS ) 10 MG tablet, Take 1 tablet (10 mg total) by mouth daily as needed for erectile dysfunction., Disp: 90 tablet, Rfl: 1   Review of Systems Review of Systems  Constitutional:  Negative for malaise/fatigue.  HENT: Negative.    Respiratory:  Negative for shortness of breath.   Cardiovascular:  Negative for chest pain, palpitations, orthopnea and leg swelling.  Gastrointestinal:  Positive for heartburn. Negative for abdominal pain, blood in stool, constipation, diarrhea, melena, nausea and vomiting.  Genitourinary:  Positive for frequency. Negative for urgency.  Musculoskeletal:  Negative for neck pain.  Skin: Negative.   Neurological:  Positive for headaches.       Pt cannot connect headaches to any bp changes  Endo/Heme/Allergies: Negative.   Psychiatric/Behavioral: Negative.         Objective:    Vitals BP (!) 165/95   Wilkerson 92   Temp 97.7 F (36.5 C)   Ht 5\' 7"  (1.702 m)   Wt 128 lb 6.4 oz (58.2 kg)   SpO2 97%   BMI 20.11 kg/m    Labs:  K+  4.1 tCO2  27  CL-  101 GLU  126 Ca  9.8 BUN  17 Crea 0.8  ALP  97 ALT   18 AST  37 TBIL  0.6 ALB  3.6  TP  6.6   Physical Examination Physical Exam Constitutional:      Appearance: Normal appearance.  HENT:     Head: Normocephalic.     Right Ear: Tympanic membrane normal.     Left Ear: Tympanic membrane normal.     Nose: Nose normal.     Mouth/Throat:     Mouth: Mucous membranes are dry.     Pharynx: Oropharynx is clear.  Eyes:     Extraocular Movements: Extraocular movements intact.     Pupils: Pupils are equal, round, and reactive to light.  Cardiovascular:     Rate and Rhythm: Normal rate.  Pulmonary:     Effort: Pulmonary effort is normal.     Breath sounds: Normal breath sounds.  Abdominal:     General: Abdomen is flat. Bowel sounds are normal.  Musculoskeletal:        General: Normal range of motion.     Cervical back: Normal range of motion.  Skin:    General: Skin is warm and dry.  Neurological:     Mental Status: He is alert.  Psychiatric:        Mood and Affect: Mood normal.       Assessment & Plan:   Assessment: Possible essential hypertension, stage 1-2 (I10). Diagnosis is based on consistently elevated blood pressure readings over the past two readings, with today's measurements being 165/95. The patient is currently not on any antihypertensive medications. Given a significant family history and lifestyle factors, the patient is at increased cardiovascular risk. The patient reports regular smoking, which further contributes to vascular strain and long-term risk.Current laboratory results do not indicate evidence of end-organ damage.  Lifestyle Modifications: DASH diet handout: Low sodium, increase fruits/vegetables, whole grains Recommend at least 30 minutes of moderate exercise 5 days/week Reduce processed foods, soda, fast food Monitor and reduce stress if possible Smoking cessation. Pt is not quite ready to quit.   Education: Discuss risks of uncontrolled HTN: stroke, heart disease, kidney damage Importance of  adherence to treatment and lifestyle changes.Educated the patient on the impact of daily smoking on cardiovascular health, emphasizing that tobacco use significantly contributes to vascular strain and increases the risk of heart attack, stroke, and progression of hypertension. Discussed the benefits of smoking cessation and offered resources to support quitting. Encourage home BP monitoring (offer a referral for BP cuff or community program if needed)   Follow up next week on the mobile unit or with PCP to reassess BP. Bp was reassessed before he left and was 141/91. Refer back to PCP for long-term management and continuity of care    Rafe Bunde, NP and Shaaron Dar DNP Student.

## 2023-08-21 ENCOUNTER — Ambulatory Visit: Admitting: Nurse Practitioner

## 2023-08-21 VITALS — BP 148/82 | HR 98 | Temp 97.4°F | Resp 16 | Wt 124.9 lb

## 2023-08-21 LAB — GLUCOSE, POCT (MANUAL RESULT ENTRY): POC Glucose: 146 mg/dL — AB (ref 70–99)

## 2023-08-21 MED ORDER — NICOTINE 14 MG/24HR TD PT24: 14.0000 mg | MEDICATED_PATCH | TRANSDERMAL | 1 refills | Status: AC

## 2023-08-21 MED ORDER — NICOTINE 7 MG/24HR TD PT24
7.0000 mg | MEDICATED_PATCH | TRANSDERMAL | 0 refills | Status: AC
Start: 2023-09-04 — End: 2024-09-03

## 2023-08-21 NOTE — Progress Notes (Signed)
 Pt expressed desire to stop smoking. Nicotine patch called in by NP, Smoking cessation techniques reviewed.

## 2023-08-21 NOTE — Patient Instructions (Addendum)
 At your annual visit with your primary care doctor: - Ask about annual lung cancer screening, low dose CT  Remember to work on deep breathing exercises to help with anxiety, and coping with quitting smoking.  Keep 5 cigarettes with you a day and leave the rest at home or a way to encourage you to smoke less. After about a week work on carrying 4 cigarettes a day and each week go down to 3 cigarettes, than 2, then one until you quit.   You can pick up the nicotine patches from your pharmacy. We will start with the 14mg  patches that you will use once a day. Make sure the take off the old one before applying a new one to a new location. Once you have quit smoking for a week we can go down to the 7mg  nicotine patches for 2-4 weeks and then stop the patches completely.

## 2023-08-21 NOTE — Progress Notes (Signed)
 Name: Steven Wilkerson   MRN: [id number]    DOB: 1960/07/13   Date:08/21/2023       Progress Note  Subjective  Chief Complaint  No chief complaint on file. Smoking cessation discussion   HPI  Pt seen today per request for nicotine patches Has been smoking since he was 63 years old Has never tried quitting before but is motivated as he notes he is just over it and wants to be better to his lungs. He gets worn out easier, no acute shortness of breath but progressive over the past few years.  Smokes about 0.5ppd, has smoked more in the past Usually smoking when around other people that stress him out.  Enjoys being in nature and fishing Does have a safe place he can keep his belongings at this time.   Denies new or acute shob, chest pain, chest tightness, unexplained weight loss, hemoptysis, mood changes. Has PCP with medicaid and upcoming primary care appt   PHQ2/9    07/29/2021    8:25 AM 02/11/2020   11:16 AM 01/02/2020   12:05 PM 04/29/2014   10:25 AM  Depression screen PHQ 2/9  Decreased Interest 0 0 0 0  Down, Depressed, Hopeless 0 0 0 0  PHQ - 2 Score 0 0 0 0  Altered sleeping 0     Tired, decreased energy 0     Change in appetite 0     Feeling bad or failure about yourself  0     Trouble concentrating 0     Moving slowly or fidgety/restless 0     Suicidal thoughts 0     PHQ-9 Score 0     Difficult doing work/chores Not difficult at all       Patient Active Problem List   Diagnosis Date Noted   ADHD (attention deficit hyperactivity disorder), inattentive type 11/14/2018   Depression, major, single episode, mild (HCC) 12/11/2017   Chronic back pain 12/14/2016   BPH associated with nocturia 01/25/2016   Erectile dysfunction 01/25/2016    Past Medical History:  Diagnosis Date   Smoking hx     No past surgical history on file.  Social History   Tobacco Use   Smoking status: Every Day    Current packs/day: 1.00    Average packs/day: 1 pack/day for 80.5 years  (80.5 ttl pk-yrs)    Types: Cigarettes    Start date: 1975   Smokeless tobacco: Never   Tobacco comments:    Pt would like to talk about smoking cessation at next visit!   Substance Use Topics   Alcohol use: No    Comment: Sober x 10 years     Current Outpatient Medications:    ALPRAZolam  (XANAX ) 1 MG tablet, Take 1 tablet (1 mg total) by mouth 2 (two) times daily as needed for anxiety., Disp: 60 tablet, Rfl: 5   amphetamine -dextroamphetamine  (ADDERALL) 30 MG tablet, Take 1 tablet by mouth 3 (three) times daily., Disp: 90 tablet, Rfl: 0   b complex vitamins tablet, Take 1 tablet by mouth daily., Disp: , Rfl:    methocarbamol  (ROBAXIN ) 500 MG tablet, Take 1 tablet (500 mg total) by mouth every 6 (six) hours as needed for muscle spasms., Disp: 120 tablet, Rfl: 5   Multiple Vitamin (MULTIVITAMIN) tablet, Take 1 tablet by mouth daily., Disp: , Rfl:    nicotine (NICODERM CQ - DOSED IN MG/24 HOURS) 14 mg/24hr patch, Place 1 patch (14 mg total) onto the skin daily., Disp: 30 patch, Rfl:  1   [START ON 09/04/2023] nicotine (NICODERM CQ - DOSED IN MG/24 HR) 7 mg/24hr patch, Place 1 patch (7 mg total) onto the skin daily., Disp: 30 patch, Rfl: 0   Oxycodone  HCl 20 MG TABS, Take 1 tablet (20 mg total) by mouth every 4 (four) hours as needed (pain)., Disp: 180 tablet, Rfl: 0   amphetamine -dextroamphetamine  (ADDERALL) 30 MG tablet, Take 1 tablet by mouth 3 (three) times daily., Disp: 90 tablet, Rfl: 0   amphetamine -dextroamphetamine  (ADDERALL) 30 MG tablet, Take 1 tablet by mouth 3 (three) times daily., Disp: 90 tablet, Rfl: 0   busPIRone  (BUSPAR ) 10 MG tablet, Take 1 tablet (10 mg total) by mouth 3 (three) times daily. (Patient not taking: Reported on 08/21/2023), Disp: 90 tablet, Rfl: 1   doxazosin  (CARDURA ) 4 MG tablet, Take 1 tablet (4 mg total) by mouth daily. (Patient not taking: Reported on 08/21/2023), Disp: 90 tablet, Rfl: 3   Oxycodone  HCl 20 MG TABS, Take 1 tablet (20 mg total) by mouth every 4  (four) hours as needed (pain)., Disp: 180 tablet, Rfl: 0   Oxycodone  HCl 20 MG TABS, Take 1 tablet (20 mg total) by mouth every 4 (four) hours as needed (pain)., Disp: 180 tablet, Rfl: 0   tadalafil  (CIALIS ) 10 MG tablet, Take 1 tablet (10 mg total) by mouth daily as needed for erectile dysfunction. (Patient not taking: Reported on 08/21/2023), Disp: 90 tablet, Rfl: 1  Allergies  Allergen Reactions   Doxazosin  Other (See Comments)    Leg weakness   Sulfa  Drugs Cross Reactors Rash   Penicillins Itching    ROS  See HPI   No other specific complaints in a complete review of systems (except as listed in HPI above).  Objective  Vitals:   08/21/23 1100  BP: (!) 148/82  Pulse: 98  Temp: (!) 97.4 F (36.3 C)  TempSrc: Oral  Weight: 124 lb 14.4 oz (56.7 kg)      Body mass index is 19.56 kg/m.  Nursing Note and Vital Signs reviewed.  Physical Exam  Constitutional: Patient appears well-developed and well-nourished. No distress.  Cardiovascular: Normal rate, regular rhythm and normal heart sounds.  No murmur heard. No BLE edema. Pulmonary/Chest: Effort normal and breath sounds bilateral upper and lower extremity expiratory wheezing, cleared with coughing.  Psychiatric: Patient has a normal mood and affect. behavior is normal. Judgment and thought content normal.   Results for orders placed or performed in visit on 08/21/23 (from the past 48 hours)  POCT glucose (manual entry)     Status: Abnormal   Collection Time: 08/21/23 11:01 AM  Result Value Ref Range   POC Glucose 146 (A) 70 - 99 mg/dl    Assessment & Plan  1. Cigarette nicotine dependence without complication (Primary) See AVS for recommendations, can follow-up routinely with nursing staff for encouragement and monitoring decreasing ppd and adjusting nicotine patch dosage and use.  - nicotine (NICODERM CQ - DOSED IN MG/24 HOURS) 14 mg/24hr patch; Place 1 patch (14 mg total) onto the skin daily.  Dispense: 30 patch;  Refill: 1 - nicotine (NICODERM CQ - DOSED IN MG/24 HR) 7 mg/24hr patch; Place 1 patch (7 mg total) onto the skin daily.  Dispense: 30 patch; Refill: 0   Recommend low dose CT lung cancer screening, follow-up on exertional shob- likely COPD- asymptomatic at this time, able to still perform ADLs without concern.   Spent > 20 minutes on patient education on smoking cessation.

## 2023-09-18 ENCOUNTER — Telehealth (INDEPENDENT_AMBULATORY_CARE_PROVIDER_SITE_OTHER): Admitting: Family Medicine

## 2023-09-18 ENCOUNTER — Encounter: Payer: Self-pay | Admitting: Family Medicine

## 2023-09-18 DIAGNOSIS — M5442 Lumbago with sciatica, left side: Secondary | ICD-10-CM | POA: Diagnosis not present

## 2023-09-18 DIAGNOSIS — G8929 Other chronic pain: Secondary | ICD-10-CM

## 2023-09-18 MED ORDER — OXYCODONE HCL 20 MG PO TABS: 20.0000 mg | ORAL_TABLET | ORAL | 0 refills | Status: DC | PRN

## 2023-09-18 MED ORDER — ALPRAZOLAM 1 MG PO TABS: 1.0000 mg | ORAL_TABLET | Freq: Two times a day (BID) | ORAL | 5 refills | Status: AC | PRN

## 2023-09-18 MED ORDER — AMPHETAMINE-DEXTROAMPHETAMINE 30 MG PO TABS: 30.0000 mg | ORAL_TABLET | Freq: Three times a day (TID) | ORAL | 0 refills | Status: DC

## 2023-09-18 MED ORDER — CYCLOBENZAPRINE HCL 10 MG PO TABS: 10.0000 mg | ORAL_TABLET | Freq: Three times a day (TID) | ORAL | 5 refills | Status: AC | PRN

## 2023-09-18 NOTE — Progress Notes (Signed)
 Subjective:    Patient ID: Steven Wilkerson, male    DOB: Dec 01, 1960, 63 y.o.   MRN: [id number]  HPI Virtual Visit via Video Note  I connected with the patient on 09/18/23 at  8:30 AM EDT by a video enabled telemedicine application and verified that I am speaking with the correct person using two identifiers.  Location patient: home Location provider:work or home office Persons participating in the virtual visit: patient, provider  I discussed the limitations of evaluation and management by telemedicine and the availability of in person appointments. The patient expressed understanding and agreed to proceed.   HPI: Here for pain management. He is doing well with pain control, but he would like to try a different muscle relaxer than Methocarbamol  because it is too sedating.    ROS: See pertinent positives and negatives per HPI.  Past Medical History:  Diagnosis Date   Smoking hx     History reviewed. No pertinent surgical history.  History reviewed. No pertinent family history.   Current Outpatient Medications:    ALPRAZolam  (XANAX ) 1 MG tablet, Take 1 tablet (1 mg total) by mouth 2 (two) times daily as needed for anxiety., Disp: 60 tablet, Rfl: 5   amphetamine -dextroamphetamine  (ADDERALL) 30 MG tablet, Take 1 tablet by mouth 3 (three) times daily., Disp: 90 tablet, Rfl: 0   amphetamine -dextroamphetamine  (ADDERALL) 30 MG tablet, Take 1 tablet by mouth 3 (three) times daily., Disp: 90 tablet, Rfl: 0   amphetamine -dextroamphetamine  (ADDERALL) 30 MG tablet, Take 1 tablet by mouth 3 (three) times daily., Disp: 90 tablet, Rfl: 0   b complex vitamins tablet, Take 1 tablet by mouth daily., Disp: , Rfl:    busPIRone  (BUSPAR ) 10 MG tablet, Take 1 tablet (10 mg total) by mouth 3 (three) times daily. (Patient not taking: Reported on 08/21/2023), Disp: 90 tablet, Rfl: 1   doxazosin  (CARDURA ) 4 MG tablet, Take 1 tablet (4 mg total) by mouth daily. (Patient not taking: Reported on 08/21/2023),  Disp: 90 tablet, Rfl: 3   methocarbamol  (ROBAXIN ) 500 MG tablet, Take 1 tablet (500 mg total) by mouth every 6 (six) hours as needed for muscle spasms., Disp: 120 tablet, Rfl: 5   Multiple Vitamin (MULTIVITAMIN) tablet, Take 1 tablet by mouth daily., Disp: , Rfl:    nicotine  (NICODERM CQ  - DOSED IN MG/24 HOURS) 14 mg/24hr patch, Place 1 patch (14 mg total) onto the skin daily., Disp: 30 patch, Rfl: 1   nicotine  (NICODERM CQ  - DOSED IN MG/24 HR) 7 mg/24hr patch, Place 1 patch (7 mg total) onto the skin daily., Disp: 30 patch, Rfl: 0   Oxycodone  HCl 20 MG TABS, Take 1 tablet (20 mg total) by mouth every 4 (four) hours as needed (pain)., Disp: 180 tablet, Rfl: 0   Oxycodone  HCl 20 MG TABS, Take 1 tablet (20 mg total) by mouth every 4 (four) hours as needed (pain)., Disp: 180 tablet, Rfl: 0   Oxycodone  HCl 20 MG TABS, Take 1 tablet (20 mg total) by mouth every 4 (four) hours as needed (pain)., Disp: 180 tablet, Rfl: 0   tadalafil  (CIALIS ) 10 MG tablet, Take 1 tablet (10 mg total) by mouth daily as needed for erectile dysfunction. (Patient not taking: Reported on 08/21/2023), Disp: 90 tablet, Rfl: 1  EXAM:  VITALS per patient if applicable:  GENERAL: alert, oriented, appears well and in no acute distress  HEENT: atraumatic, conjunttiva clear, no obvious abnormalities on inspection of external nose and ears  NECK: normal movements of the head  and neck  LUNGS: on inspection no signs of respiratory distress, breathing rate appears normal, no obvious gross SOB, gasping or wheezing  CV: no obvious cyanosis  MS: moves all visible extremities without noticeable abnormality  PSYCH/NEURO: pleasant and cooperative, no obvious depression or anxiety, speech and thought processing grossly intact  ASSESSMENT AND PLAN: Pain management. Indication for chronic opioid: low back pain Medication and dose: Oxycodone  20 mg # pills per month: 180 Last UDS date: 10-11-22 Opioid Treatment Agreement signed (Y/N):  03-20-17 Opioid Treatment Agreement last reviewed with patient:  09-18-23 [facility] reviewed this encounter (include red flags): Yes Meds were refilled. We will stop Methocarbamol  and try Flexeril  10 mg as needed.  [name], MD  Discussed the following assessment and plan:  No diagnosis found.     I discussed the assessment and treatment plan with the patient. The patient was provided an opportunity to ask questions and all were answered. The patient agreed with the plan and demonstrated an understanding of the instructions.   The patient was advised to call back or seek an in-person evaluation if the symptoms worsen or if the condition fails to improve as anticipated.      Review of Systems     Objective:   Physical Exam        Assessment & Plan:

## 2023-10-29 ENCOUNTER — Other Ambulatory Visit (HOSPITAL_COMMUNITY): Payer: Self-pay

## 2023-10-29 ENCOUNTER — Telehealth: Payer: Self-pay

## 2023-10-29 NOTE — Telephone Encounter (Signed)
 Pharmacy Patient Advocate Encounter   Received notification from Onbase that prior authorization for Adderall 30 is due for renewal.   Insurance verification completed.   The patient is insured through [employer].  Action: PA required; PA submitted to above mentioned insurance via Latent Key/confirmation #/EOC [id number] Status is pending

## 2023-10-30 ENCOUNTER — Other Ambulatory Visit (HOSPITAL_COMMUNITY): Payer: Self-pay

## 2023-10-30 NOTE — Telephone Encounter (Signed)
 Pharmacy Patient Advocate Encounter  Received notification from [employer] that Prior Authorization for Adderall 30has been DENIED.  Full denial letter will be uploaded to the media tab. See denial reason below.   It seems they would approve 60/30 bid    PA #/Case ID/Reference #: # [id number]

## 2023-10-31 NOTE — Telephone Encounter (Signed)
 I am happy to change this to 60 pills per month if he agrees

## 2023-11-01 MED ORDER — AMPHETAMINE-DEXTROAMPHETAMINE 30 MG PO TABS: 30.0000 mg | ORAL_TABLET | Freq: Two times a day (BID) | ORAL | 0 refills | Status: AC

## 2023-11-01 NOTE — Telephone Encounter (Signed)
 I resent them all for #60

## 2023-11-01 NOTE — Telephone Encounter (Signed)
 Yes 60 tablets per month

## 2023-11-01 NOTE — Telephone Encounter (Signed)
 Does he agree to #60 only ?

## 2023-11-01 NOTE — Addendum Note (Signed)
 Addended by: JOHNNY SENIOR A on: 11/01/2023 12:22 PM   Modules accepted: Orders

## 2023-12-17 ENCOUNTER — Telehealth: Admitting: Family Medicine

## 2023-12-17 ENCOUNTER — Encounter: Payer: Self-pay | Admitting: Family Medicine

## 2023-12-17 ENCOUNTER — Ambulatory Visit: Admitting: Family Medicine

## 2023-12-17 DIAGNOSIS — G8929 Other chronic pain: Secondary | ICD-10-CM

## 2023-12-17 NOTE — Progress Notes (Unsigned)
   Subjective:    Patient ID: Steven Wilkerson, male    DOB: 1960-06-29, 63 y.o.   MRN: [id number]  HPI We attempted to make a video visit, but could not make a good connection. Instead he will have an in person OV tomorrow.    Review of Systems     Objective:   Physical Exam        Assessment & Plan:

## 2023-12-18 ENCOUNTER — Ambulatory Visit: Admitting: Family Medicine

## 2023-12-19 ENCOUNTER — Encounter: Payer: Self-pay | Admitting: Family Medicine

## 2023-12-19 ENCOUNTER — Ambulatory Visit (INDEPENDENT_AMBULATORY_CARE_PROVIDER_SITE_OTHER): Admitting: Family Medicine

## 2023-12-19 VITALS — BP 142/86 | HR 95 | Temp 98.3°F | Wt 129.0 lb

## 2023-12-19 DIAGNOSIS — M5442 Lumbago with sciatica, left side: Secondary | ICD-10-CM

## 2023-12-19 DIAGNOSIS — G8929 Other chronic pain: Secondary | ICD-10-CM | POA: Diagnosis not present

## 2023-12-19 MED ORDER — OXYCODONE HCL 20 MG PO TABS: 20.0000 mg | ORAL_TABLET | ORAL | 0 refills | Status: AC | PRN

## 2023-12-19 NOTE — Progress Notes (Signed)
   Subjective:    Patient ID: Steven Wilkerson, male    DOB: [birth year], 63 y.o.   MRN: [id number]  HPI Here for pain management. He is doing well.    Review of Systems  Constitutional: Negative.   Musculoskeletal:  Positive for back pain.       Objective:   Physical Exam Constitutional:      Appearance: Normal appearance.  Neurological:     Mental Status: He is alert.           Assessment & Plan:  Pain management.  Indication for chronic opioid: low back pain  Medication and dose: Oxycodone  20 mg  # pills per month: 180 Last UDS date: 12-19-23 Opioid Treatment Agreement signed (Y/N): 03-20-17 Opioid Treatment Agreement last reviewed with patient:  12-19-23 [facility] reviewed this encounter (include red flags): Yes Meds were refilled.  [name], MD

## 2023-12-20 NOTE — Addendum Note (Signed)
 Addended by: JOHNNY SENIOR A on: 12/20/2023 07:43 AM   Modules accepted: Orders

## 2023-12-23 LAB — DRUG MONITORING, PANEL 8 WITH CONFIRMATION, URINE
6 Acetylmorphine: NEGATIVE ng/mL (ref <10)
Alcohol Metabolites: NEGATIVE ng/mL (ref <500)
Amphetamine: 2977 ng/mL — ABNORMAL HIGH (ref <250)
Amphetamines: POSITIVE ng/mL — AB (ref <500)
Benzodiazepines: NEGATIVE ng/mL (ref <100)
Benzoylecgonine: 3322 ng/mL — ABNORMAL HIGH (ref <100)
Buprenorphine, Urine: NEGATIVE ng/mL (ref <5)
Cocaine Metabolite: POSITIVE ng/mL — AB (ref <150)
Creatinine: 28.5 mg/dL (ref >=20.0)
MDMA: NEGATIVE ng/mL (ref <500)
Marijuana Metabolite: NEGATIVE ng/mL (ref <20)
Methamphetamine: 4715 ng/mL — ABNORMAL HIGH (ref <250)
Opiates: NEGATIVE ng/mL (ref <100)
Oxidant: NEGATIVE ug/mL (ref <200)
Oxycodone: NEGATIVE ng/mL (ref <100)
pH: 6.5 (ref 4.5–9.0)

## 2023-12-23 LAB — DM TEMPLATE

## 2023-12-24 ENCOUNTER — Encounter: Payer: Self-pay | Admitting: Family Medicine

## 2023-12-24 ENCOUNTER — Ambulatory Visit: Payer: Self-pay | Admitting: Family Medicine

## 2023-12-24 NOTE — Telephone Encounter (Signed)
 Please call his pharmacy and CANCEL all remaining refills for everything in their system

## 2024-01-04 NOTE — Progress Notes (Signed)
 Spoke with pt pharmacy advised of Dr Johnny recommendations to cancel all Rx refills for Oxycodone , voiced understanding

## 2024-02-26 ENCOUNTER — Other Ambulatory Visit: Payer: Self-pay | Admitting: Family Medicine

## 2024-02-26 ENCOUNTER — Telehealth: Payer: Self-pay | Admitting: Family Medicine

## 2024-02-26 NOTE — Telephone Encounter (Signed)
 Copied from CRM #[id number]. Topic: Clinical - Prescription Issue >> Feb 26, 2024  4:47 PM Hadassah PARAS wrote: Reason for CRM: Pt is calling to check why medication  oxyCODONE  HCl 1 tablet Oral Every 4 hours PRN, for pain was refused. Please call pt on #[phone]

## 2024-02-28 NOTE — Telephone Encounter (Signed)
 Dr Johnny is nolonger pt PCP, Dismissed in 2025

## 2024-02-28 NOTE — Telephone Encounter (Signed)
 Pt called office questioning why he has been dismissed and can no longer get medication I took phone call due to nancy not being in office made pt aware on dr fry note due to pt not following the controlled substance contract and pt was aware and verbalized understanding made pt aware of other offices he can find a new pcp at and he had no further concerns.

## 2024-02-29 NOTE — Telephone Encounter (Signed)
 Noted
